# Patient Record
Sex: Female | Born: 1990 | Race: White | Hispanic: Yes | Marital: Married | Smoking: Never smoker
Health system: Southern US, Community
[De-identification: ages and names within clinical notes are randomized; demographics above are authoritative.]

## PROBLEM LIST (undated history)

## (undated) ENCOUNTER — Inpatient Hospital Stay: Payer: Self-pay

## (undated) ENCOUNTER — Emergency Department: Admission: EM | Payer: 59 | Source: Home / Self Care

## (undated) DIAGNOSIS — K649 Unspecified hemorrhoids: Secondary | ICD-10-CM

## (undated) DIAGNOSIS — Z789 Other specified health status: Secondary | ICD-10-CM

## (undated) HISTORY — DX: Unspecified hemorrhoids: K64.9

## (undated) HISTORY — PX: WISDOM TOOTH EXTRACTION: SHX21

---

## 2018-03-04 DIAGNOSIS — K644 Residual hemorrhoidal skin tags: Secondary | ICD-10-CM | POA: Insufficient documentation

## 2018-03-04 DIAGNOSIS — K648 Other hemorrhoids: Secondary | ICD-10-CM | POA: Insufficient documentation

## 2018-03-04 DIAGNOSIS — K649 Unspecified hemorrhoids: Secondary | ICD-10-CM | POA: Insufficient documentation

## 2019-12-22 ENCOUNTER — Ambulatory Visit (INDEPENDENT_AMBULATORY_CARE_PROVIDER_SITE_OTHER): Payer: BLUE CROSS/BLUE SHIELD

## 2019-12-22 ENCOUNTER — Other Ambulatory Visit: Payer: Self-pay

## 2019-12-22 ENCOUNTER — Ambulatory Visit
Admission: EM | Admit: 2019-12-22 | Discharge: 2019-12-22 | Disposition: A | Discharge status: Home / Self Care | Payer: BLUE CROSS/BLUE SHIELD | Attending: Family Medicine | Admitting: Family Medicine

## 2019-12-22 DIAGNOSIS — R109 Unspecified abdominal pain: Secondary | ICD-10-CM | POA: Diagnosis not present

## 2019-12-22 DIAGNOSIS — R1030 Lower abdominal pain, unspecified: Secondary | ICD-10-CM | POA: Insufficient documentation

## 2019-12-22 DIAGNOSIS — N83201 Unspecified ovarian cyst, right side: Secondary | ICD-10-CM | POA: Insufficient documentation

## 2019-12-22 LAB — PREGNANCY, URINE: Preg Test, Ur: NEGATIVE

## 2019-12-22 LAB — CBC WITH DIFFERENTIAL/PLATELET
Abs Immature Granulocytes: 0.03 10*3/uL (ref 0.00–0.07)
Basophils Absolute: 0 10*3/uL (ref 0.0–0.1)
Basophils Relative: 0 %
Eosinophils Absolute: 0.1 10*3/uL (ref 0.0–0.5)
Eosinophils Relative: 1 %
HCT: 38.1 % (ref 36.0–46.0)
Hemoglobin: 12.6 g/dL (ref 12.0–15.0)
Immature Granulocytes: 0 %
Lymphocytes Relative: 27 %
Lymphs Abs: 2.9 10*3/uL (ref 0.7–4.0)
MCH: 26.8 pg (ref 26.0–34.0)
MCHC: 33.1 g/dL (ref 30.0–36.0)
MCV: 81.1 fL (ref 80.0–100.0)
Monocytes Absolute: 0.5 10*3/uL (ref 0.1–1.0)
Monocytes Relative: 5 %
Neutro Abs: 6.9 10*3/uL (ref 1.7–7.7)
Neutrophils Relative %: 67 %
Platelets: 307 10*3/uL (ref 150–400)
RBC: 4.7 MIL/uL (ref 3.87–5.11)
RDW: 13.8 % (ref 11.5–15.5)
WBC: 10.5 10*3/uL (ref 4.0–10.5)
nRBC: 0 % (ref 0.0–0.2)

## 2019-12-22 LAB — URINALYSIS, COMPLETE (UACMP) WITH MICROSCOPIC
Bilirubin Urine: NEGATIVE
Glucose, UA: NEGATIVE mg/dL
Ketones, ur: NEGATIVE mg/dL
Nitrite: NEGATIVE
Protein, ur: NEGATIVE mg/dL
Specific Gravity, Urine: 1.02 (ref 1.005–1.030)
pH: 7 (ref 5.0–8.0)

## 2019-12-22 LAB — BASIC METABOLIC PANEL
Anion gap: 7 (ref 5–15)
BUN: 9 mg/dL (ref 6–20)
CO2: 25 mmol/L (ref 22–32)
Calcium: 9 mg/dL (ref 8.9–10.3)
Chloride: 105 mmol/L (ref 98–111)
Creatinine, Ser: 0.6 mg/dL (ref 0.44–1.00)
GFR calc Af Amer: >60 mL/min (ref >60)
GFR calc non Af Amer: >60 mL/min (ref >60)
Glucose, Bld: 84 mg/dL (ref 70–99)
Potassium: 3.6 mmol/L (ref 3.5–5.1)
Sodium: 137 mmol/L (ref 135–145)

## 2019-12-22 MED ORDER — CEPHALEXIN 500 MG PO CAPS: 500.0000 mg | ORAL_CAPSULE | Freq: Two times a day (BID) | ORAL | 0 refills | Status: AC

## 2019-12-22 MED ORDER — IOHEXOL 300 MG/ML  SOLN
100.0000 mL | Freq: Once | INTRAMUSCULAR | Status: AC | PRN
  Administered 2019-12-22: 15:00:00 100 mL via INTRAVENOUS

## 2019-12-22 NOTE — ED Provider Notes (Addendum)
[facility] ____________________________________________  Time seen: Approximately 3:11 PM  I have reviewed the triage vital signs and the nursing notes.   HISTORY  Chief Complaint Abdominal Pain   HPI Caroline Mendoza is a 29 y.o. female presenting for evaluation of abdominal pain.  Patient reports abdominal pain has been present for 1 week.  States initially she had abdominal pain to right lower quadrant, but now also having pain to the left lower quadrant.  Occasional nausea.  Denies vomiting, diarrhea or atypical constipation.  Denies flank pain, hematuria, dysuria, back pain, vaginal discharge or vaginal complaints.  Denies pregnancy.  Denies concerns of STDs.  Has continued to eat and drink well.  States movement does aggravate pain.  Denies other aggravating or alleviating factors.  No history of similar in the past.  Reports otherwise doing well.  No recent sickness.  Does not drink alcohol.  Patient's last menstrual period was 12/08/2019.    History reviewed. No pertinent past medical history. Denies past medical history.  There are no problems to display for this patient.   History reviewed. No pertinent surgical history.   No current facility-administered medications for this encounter.  Current Outpatient Medications:  .  cephALEXin (KEFLEX) 500 MG capsule, Take 1 capsule (500 mg total) by mouth 2 (two) times daily for 7 days., Disp: 14 capsule, Rfl: 0 .  SPRINTEC 28 0.25-35 MG-MCG tablet, Take 1 tablet by mouth daily., Disp: , Rfl:   Allergies Patient has no known allergies.  family history. Sister: Cholecystectomy.  Social History Social History   Tobacco Use  . Smoking status: Never Smoker  . Smokeless tobacco: Never Used  Substance Use Topics  . Alcohol use: Not Currently  . Drug use: Never    Review of Systems Constitutional: No fever ENT: No sore throat. Cardiovascular: Denies chest pain. Respiratory: Denies shortness of  breath. Gastrointestinal: No abdominal pain.  No nausea, no vomiting.  No diarrhea.  No constipation. Genitourinary: Negative for dysuria. Musculoskeletal: Negative for back pain. Skin: Negative for rash.   ____________________________________________   PHYSICAL EXAM:  VITAL SIGNS: ED Triage Vitals  Enc Vitals Group     BP 12/22/19 1234 (!) 120/91     Pulse Rate 12/22/19 1234 97     Resp 12/22/19 1234 19     Temp 12/22/19 1234 98.1 F (36.7 C)     Temp Source 12/22/19 1234 Oral     SpO2 12/22/19 1234 100 %     Weight 12/22/19 1237 219 lb (99.3 kg)     Height 12/22/19 1237 5\' 4"  (1.626 m)     Head Circumference --      Peak Flow --      Pain Score 12/22/19 1236 10     Pain Loc --      Pain Edu? --      Excl. in [place]? --     Constitutional: Alert and oriented. Well appearing and in no acute distress. Eyes: Conjunctivae are normal.  ENT      Head: Normocephalic and atraumatic. Cardiovascular: Normal rate, regular rhythm. Grossly normal heart sounds.  Good peripheral circulation. Respiratory: Normal respiratory effort without tachypnea nor retractions. Breath sounds are clear and equal bilaterally. No wheezes, rales, rhonchi. Gastrointestinal: No distention. Normal Bowel sounds.  Mild to moderate right lower quadrant and left lower quadrant tenderness palpation.  No suprapubic or pelvic tenderness.  No CVA tenderness. Musculoskeletal:   No midline cervical, thoracic or lumbar tenderness to palpation.  Neurologic:  Normal speech and  language.  Speech is normal. No gait instability.  Skin:  Skin is warm, dry and intact. No rash noted. Psychiatric: Mood and affect are normal. Speech and behavior are normal. Patient exhibits appropriate insight and judgment   ___________________________________________   LABS (all labs ordered are listed, but only abnormal results are displayed)  Labs Reviewed  URINALYSIS, COMPLETE (UACMP) WITH MICROSCOPIC - Abnormal; Notable for the following  components:      Result Value   Hgb urine dipstick TRACE (*)    Leukocytes,Ua TRACE (*)    Bacteria, UA FEW (*)    All other components within normal limits  URINE CULTURE  PREGNANCY, URINE  CBC WITH DIFFERENTIAL/PLATELET  BASIC METABOLIC PANEL    RADIOLOGY  CT ABDOMEN PELVIS W CONTRAST  Result Date: 12/22/2019 CLINICAL DATA:  Lower abdominal pain. Nausea. EXAM: CT ABDOMEN AND PELVIS WITH CONTRAST TECHNIQUE: Multidetector CT imaging of the abdomen and pelvis was performed using the standard protocol following bolus administration of intravenous contrast. CONTRAST:  OMNIPAQUE IOHEXOL 300 MG/ML  SOLN COMPARISON:  None. FINDINGS: Lower chest: Normal. Hepatobiliary: No focal liver abnormality is seen. No gallstones, gallbladder wall thickening, or biliary dilatation. Pancreas: Unremarkable. No pancreatic ductal dilatation or surrounding inflammatory changes. Spleen: Normal in size without focal abnormality. Adrenals/Urinary Tract: Adrenal glands are unremarkable. Kidneys are normal, without renal calculi, focal lesion, or hydronephrosis. Bladder is unremarkable. Stomach/Bowel: Stomach is within normal limits. Appendix appears normal. No evidence of bowel wall thickening, distention, or inflammatory changes. Vascular/Lymphatic: No significant vascular findings are present. No enlarged abdominal or pelvic lymph nodes. Reproductive: 3.2 cm cyst on the right ovary. Uterus and left ovary are normal. Other: Tiny amount of free fluid in the pelvic cul-de-sac, normal for a female of this age. No abdominal wall hernias. Musculoskeletal: No acute or significant osseous findings. IMPRESSION: 3.2 cm cyst on the right ovary. Otherwise, benign-appearing abdomen and pelvis. Electronically Signed   By: Francene Boyers M.D.   On: 12/22/2019 14:48   ____________________________________________   PROCEDURES Procedures   INITIAL IMPRESSION / ASSESSMENT AND PLAN / ED COURSE  Pertinent labs & imaging results  that were available during my care of the patient were reviewed by me and considered in my medical decision making (see chart for details).  Overall well-appearing patient.  No acute distress.  Abdominal pain for the last 1 week.  Urinalysis reviewed, will culture due to potential concern of UTI.  Labs reviewed and discussed with patient.  CT abdomen and pelvis as above per radiologist, 3.2 cm cyst on right ovary, otherwise benign appearing abdomen and pelvis.  Discussed these results in detail with patient.  Will empirically treat with oral Keflex.  Recommend follow-up closely with her primary care or OB/GYN this week.  Discussed strict reevaluation parameters. Discussed indication, risks and benefits of medications with patient.  Discussed follow up and return parameters including no resolution or any worsening concerns. Patient verbalized understanding and agreed to plan.   ____________________________________________   FINAL CLINICAL IMPRESSION(S) / ED DIAGNOSES  Final diagnoses:  Lower abdominal pain  Right ovarian cyst     ED Discharge Orders         Ordered    cephALEXin (KEFLEX) 500 MG capsule  2 times daily     12/22/19 1455           Note: This dictation was prepared with Dragon dictation along with smaller phrase technology. Any transcriptional errors that result from this process are unintentional.  Renford Dills, NP 12/22/19 1606

## 2019-12-22 NOTE — Discharge Instructions (Signed)
Take medication as prescribed. Rest. Drink plenty of fluids.   Follow up with your primary care physician or OBGYN (see above) this week for follow up.    Return to Urgent care or ER for new or worsening concerns.

## 2019-12-22 NOTE — ED Triage Notes (Signed)
Pt with abdominal pain x one week. Started in RLQ and has now migrated to LLQ. Describes as sharp in nature. No vomiting or diarrhea but occasional nausea. No fevers

## 2019-12-23 LAB — URINE CULTURE: Culture: <10000 — AB

## 2019-12-23 NOTE — ED Notes (Signed)
Ct Approved for 12/22/2019 from [place]. Authorization # [id number]. [facility]

## 2020-03-05 ENCOUNTER — Encounter: Payer: BLUE CROSS/BLUE SHIELD | Admitting: Certified Nurse Midwife

## 2020-05-22 ENCOUNTER — Ambulatory Visit: Payer: BLUE CROSS/BLUE SHIELD | Admitting: Family Medicine

## 2020-09-05 ENCOUNTER — Other Ambulatory Visit: Payer: Self-pay

## 2020-09-05 ENCOUNTER — Encounter: Payer: Self-pay | Admitting: Emergency Medicine

## 2020-09-05 ENCOUNTER — Ambulatory Visit
Admission: EM | Admit: 2020-09-05 | Discharge: 2020-09-05 | Disposition: A | Discharge status: Home / Self Care | Payer: BLUE CROSS/BLUE SHIELD | Attending: Emergency Medicine | Admitting: Emergency Medicine

## 2020-09-05 DIAGNOSIS — Z3A01 Less than 8 weeks gestation of pregnancy: Secondary | ICD-10-CM | POA: Diagnosis present

## 2020-09-05 LAB — PREGNANCY, URINE: Preg Test, Ur: POSITIVE — AB

## 2020-09-05 NOTE — ED Triage Notes (Signed)
Patient states that took a pregnancy test at home yesterday and the result was positive.  Patient reports nausea for the past 2 weeks.

## 2020-09-05 NOTE — ED Provider Notes (Signed)
[facility]    CSN: [id number] Arrival date & time: 09/05/20  1212      History   Chief Complaint Chief Complaint  Patient presents with  . Possible Pregnancy    HPI Caroline Mendoza is a 29 y.o. female.   29 yo female who is here for a confirmation pregnancy test. She reports that she has not been feeling herself for the last 2 weeks at home, took a home pregnancy test yesterday that was positive and wanted to be sure.  Her LMP was August 01, 2020. Her menses are irregular since stopping OCP's in April in an attempt to get pregnant.      History reviewed. No pertinent past medical history.  There are no problems to display for this patient.   History reviewed. No pertinent surgical history.  OB History   No obstetric history on file.      Home Medications    Prior to Admission medications   Medication Sig Start Date End Date Taking? Authorizing Provider  Prenatal Vit-Fe Fumarate-FA (PRENATAL VITAMINS PO) Take by mouth.   Yes [provider]  SPRINTEC 28 0.25-35 MG-MCG tablet Take 1 tablet by mouth daily. 12/05/19   [provider]    Family History History reviewed. No pertinent family history.  Social History Social History   Tobacco Use  . Smoking status: Never Smoker  . Smokeless tobacco: Never Used  Vaping Use  . Vaping Use: Never used  Substance Use Topics  . Alcohol use: Not Currently  . Drug use: Never     Allergies   Patient has no known allergies.   Review of Systems Review of Systems  Constitutional: Negative for activity change, appetite change and fever.  HENT: Negative for congestion and rhinorrhea.   Respiratory: Negative for cough, shortness of breath and wheezing.   Cardiovascular: Negative for chest pain.  Gastrointestinal: Positive for nausea. Negative for abdominal pain, constipation and vomiting.  Genitourinary: Positive for menstrual problem. Negative for pelvic pain, vaginal bleeding and  vaginal discharge.  Neurological: Negative for dizziness and syncope.  Hematological: Negative.   Psychiatric/Behavioral: Negative.      Physical Exam Triage Vital Signs ED Triage Vitals  Enc Vitals Group     BP 09/05/20 1229 119/78     Pulse Rate 09/05/20 1229 (!) 104     Resp 09/05/20 1229 14     Temp 09/05/20 1229 98.6 F (37 C)     Temp Source 09/05/20 1229 Oral     SpO2 09/05/20 1229 100 %     Weight 09/05/20 1225 240 lb (108.9 kg)     Height 09/05/20 1225 5\' 4"  (1.626 m)     Head Circumference --      Peak Flow --      Pain Score 09/05/20 1225 0     Pain Loc --      Pain Edu? --      Excl. in [place]? --    No data found.  Updated Vital Signs BP 119/78 (BP Location: Left Arm)   Pulse (!) 104   Temp 98.6 F (37 C) (Oral)   Resp 14   Ht 5\' 4"  (1.626 m)   Wt 240 lb (108.9 kg)   LMP 08/01/2020 (Exact Date)   SpO2 100%   BMI 41.20 kg/m   Visual Acuity Right Eye Distance:   Left Eye Distance:   Bilateral Distance:    Right Eye Near:   Left Eye Near:    Bilateral Near:  Physical Exam Vitals and nursing note reviewed.  Constitutional:      Appearance: She is obese.  HENT:     Head: Normocephalic and atraumatic.  Eyes:     Extraocular Movements: Extraocular movements intact.     Conjunctiva/sclera: Conjunctivae normal.     Pupils: Pupils are equal, round, and reactive to light.  Cardiovascular:     Rate and Rhythm: Regular rhythm.     Pulses: Normal pulses.     Heart sounds: Normal heart sounds.  Pulmonary:     Effort: Pulmonary effort is normal.     Breath sounds: Normal breath sounds.  Musculoskeletal:        General: Normal range of motion.     Cervical back: Normal range of motion and neck supple.  Skin:    General: Skin is warm and dry.     Capillary Refill: Capillary refill takes less than 2 seconds.  Neurological:     General: No focal deficit present.     Mental Status: She is alert and oriented to person, place, and time.  Psychiatric:         Mood and Affect: Mood normal.        Behavior: Behavior normal.        Thought Content: Thought content normal.        Judgment: Judgment normal.      UC Treatments / Results  Labs (all labs ordered are listed, but only abnormal results are displayed) Labs Reviewed  PREGNANCY, URINE - Abnormal; Notable for the following components:      Result Value   Preg Test, Ur POSITIVE (*)    All other components within normal limits    EKG   Radiology No results found.  Procedures Procedures (including critical care time)  Medications Ordered in UC Medications - No data to display  Initial Impression / Assessment and Plan / UC Course  I have reviewed the triage vital signs and the nursing notes.  Pertinent labs & imaging results that were available during my care of the patient were reviewed by me and considered in my medical decision making (see chart for details).   Patient is here for a confirmatory pregnancy test as she took a home test that was positive. She reports feeling nauseated for the past 2 weeks.  She has been trying to get pregnant and went off OCP's in April- menses have been irregular since. She does not have an OB or PCP. Will make referral.    Final Clinical Impressions(s) / UC Diagnoses   Final diagnoses:  Less than [redacted] weeks gestation of pregnancy     Discharge Instructions     I have made a referral for assistance in finding a PCP.   You can also call  [facility] Monday and try to schedule a new patient appointment.  If you smoke or drink stop.  Start taking a pre-natal vitamin daily.     ED Prescriptions    None     PDMP not reviewed this encounter.   Becky [place], NP 09/05/20 1258

## 2020-09-05 NOTE — Discharge Instructions (Signed)
I have made a referral for assistance in finding a PCP.   You can also call [facility] Monday and try to schedule a new patient appointment.  If you smoke or drink stop.  Start taking a pre-natal vitamin daily.

## 2020-09-25 DIAGNOSIS — O09892 Supervision of other high risk pregnancies, second trimester: Secondary | ICD-10-CM | POA: Insufficient documentation

## 2020-10-23 LAB — OB RESULTS CONSOLE GC/CHLAMYDIA
Chlamydia: NEGATIVE
Gonorrhea: NEGATIVE

## 2020-10-23 LAB — OB RESULTS CONSOLE VARICELLA ZOSTER ANTIBODY, IGG: Varicella: IMMUNE

## 2020-10-23 LAB — OB RESULTS CONSOLE RUBELLA ANTIBODY, IGM: Rubella: IMMUNE

## 2020-10-23 LAB — OB RESULTS CONSOLE HEPATITIS B SURFACE ANTIGEN: Hepatitis B Surface Ag: NEGATIVE

## 2020-12-09 ENCOUNTER — Ambulatory Visit
Admission: EM | Admit: 2020-12-09 | Discharge: 2020-12-09 | Disposition: A | Discharge status: Home / Self Care | Payer: BLUE CROSS/BLUE SHIELD | Attending: Family Medicine | Admitting: Family Medicine

## 2020-12-09 ENCOUNTER — Other Ambulatory Visit: Payer: Self-pay

## 2020-12-09 ENCOUNTER — Encounter: Payer: Self-pay | Admitting: Emergency Medicine

## 2020-12-09 DIAGNOSIS — O26892 Other specified pregnancy related conditions, second trimester: Secondary | ICD-10-CM | POA: Insufficient documentation

## 2020-12-09 DIAGNOSIS — Z79899 Other long term (current) drug therapy: Secondary | ICD-10-CM | POA: Insufficient documentation

## 2020-12-09 DIAGNOSIS — R0981 Nasal congestion: Secondary | ICD-10-CM | POA: Diagnosis not present

## 2020-12-09 DIAGNOSIS — B9789 Other viral agents as the cause of diseases classified elsewhere: Secondary | ICD-10-CM | POA: Diagnosis not present

## 2020-12-09 DIAGNOSIS — J988 Other specified respiratory disorders: Secondary | ICD-10-CM | POA: Insufficient documentation

## 2020-12-09 DIAGNOSIS — Z3A17 17 weeks gestation of pregnancy: Secondary | ICD-10-CM | POA: Insufficient documentation

## 2020-12-09 DIAGNOSIS — H9201 Otalgia, right ear: Secondary | ICD-10-CM | POA: Insufficient documentation

## 2020-12-09 DIAGNOSIS — R059 Cough, unspecified: Secondary | ICD-10-CM | POA: Diagnosis not present

## 2020-12-09 DIAGNOSIS — R067 Sneezing: Secondary | ICD-10-CM | POA: Insufficient documentation

## 2020-12-09 DIAGNOSIS — Z20822 Contact with and (suspected) exposure to covid-19: Secondary | ICD-10-CM | POA: Diagnosis not present

## 2020-12-09 LAB — RESP PANEL BY RT-PCR (FLU A&B, COVID) ARPGX2
Influenza A by PCR: NEGATIVE
Influenza B by PCR: NEGATIVE
SARS Coronavirus 2 by RT PCR: NEGATIVE

## 2020-12-09 NOTE — ED Triage Notes (Signed)
Patient c/o right ear pain, nasal congestion and cough that started Sunday.

## 2020-12-09 NOTE — ED Provider Notes (Signed)
[facility]    CSN: [id number] Arrival date & time: 12/09/20  1015      History   Chief Complaint Chief Complaint  Patient presents with  . Cough  . Nasal Congestion  . Ear Pain   HPI  29 year old female who is currently pregnant presents with respiratory symptoms.  Patient reports that her symptoms started on Sunday.  She reports right ear pain, congestion, cough and sneezing.  No fever.  No reported sick contacts.  She is currently [redacted] weeks pregnant.  No relieving factors.  No other complaints.  Home Medications    Prior to Admission medications   Medication Sig Start Date End Date Taking? Authorizing Provider  Prenatal Vit-Fe Fumarate-FA (PRENATAL VITAMINS PO) Take by mouth.   Yes [provider]  SPRINTEC 28 0.25-35 MG-MCG tablet Take 1 tablet by mouth daily. 12/05/19   [provider]   Social History Social History   Tobacco Use  . Smoking status: Never Smoker  . Smokeless tobacco: Never Used  Vaping Use  . Vaping Use: Never used  Substance Use Topics  . Alcohol use: Not Currently  . Drug use: Never     Allergies   Patient has no known allergies.   Review of Systems Review of Systems Per HPI  Physical Exam Triage Vital Signs ED Triage Vitals  Enc Vitals Group     BP 12/09/20 1232 106/76     Pulse Rate 12/09/20 1232 88     Resp 12/09/20 1232 18     Temp 12/09/20 1232 98 F (36.7 C)     Temp Source 12/09/20 1232 Oral     SpO2 12/09/20 1232 100 %     Weight --      Height 12/09/20 1141 5\' 5"  (1.651 m)     Head Circumference --      Peak Flow --      Pain Score 12/09/20 1141 5     Pain Loc --      Pain Edu? --      Excl. in [place]? --    Updated Vital Signs BP 106/76 (BP Location: Right Arm)   Pulse 88   Temp 98 F (36.7 C) (Oral)   Resp 18   Ht 5\' 5"  (1.651 m)   LMP  (Exact Date)   SpO2 100%   BMI 39.94 kg/m   Visual Acuity Right Eye Distance:   Left Eye Distance:   Bilateral Distance:    Right Eye  Near:   Left Eye Near:    Bilateral Near:     Physical Exam Vitals and nursing note reviewed.  Constitutional:      General: She is not in acute distress.    Appearance: Normal appearance. She is not ill-appearing.  HENT:     Head: Normocephalic and atraumatic.  Eyes:     General:        Right eye: No discharge.        Left eye: No discharge.     Conjunctiva/sclera: Conjunctivae normal.  Cardiovascular:     Rate and Rhythm: Normal rate and regular rhythm.  Pulmonary:     Effort: Pulmonary effort is normal.     Breath sounds: Normal breath sounds. No wheezing, rhonchi or rales.  Neurological:     Mental Status: She is alert.  Psychiatric:        Mood and Affect: Mood normal.        Behavior: Behavior normal.    UC Treatments / Results  Labs (all labs ordered are listed, but only abnormal results are displayed) Labs Reviewed  RESP PANEL BY RT-PCR (FLU A&B, COVID) [id number]    EKG   Radiology No results found.  Procedures Procedures (including critical care time)  Medications Ordered in UC Medications - No data to display  Initial Impression / Assessment and Plan / UC Course  I have reviewed the triage vital signs and the nursing notes.  Pertinent labs & imaging results that were available during my care of the patient were reviewed by me and considered in my medical decision making (see chart for details).    29 year old female presents with a viral URI.  Covid and flu testing negative today.  Advised symptomatic treatment with over-the-counter Tylenol.  Advised to call OB/GYN regarding over-the-counter medications for cough and cold that they find acceptable.  Supportive care.  Final Clinical Impressions(s) / UC Diagnoses   Final diagnoses:  Viral respiratory infection     Discharge Instructions     Tylenol 1000 mg three times daily as needed for pain.  Lots of fluids.  OTC medications as recommend by OB GYN.  I will call with COVID test  results.  Take care  Dr. Adriana Simas    ED Prescriptions    None     PDMP not reviewed this encounter.   Tommie Sams, [place] 12/09/20 1416

## 2020-12-09 NOTE — Discharge Instructions (Signed)
Tylenol 1000 mg three times daily as needed for pain.  Lots of fluids.  OTC medications as recommend by OB GYN.  I will call with COVID test results.  Take care  Dr. Adriana Simas

## 2020-12-12 NOTE — L&D Delivery Note (Signed)
Delivery Note  First Stage: Labor onset: 2030 Induction: cytotec, cook cath, pitocin, AROM Analgesia /Anesthesia intrapartum: fentanyl x 1 dose, Epidural.  AROM at 1402  Second Stage: Complete dilation at 0607 Onset of pushing at 0610 FHR second stage Cat II with terminal brady to 60bpm x .   Delivery of a viable female infant at [phone] on 05/09/21 by CNM delivery of fetal head in LOA position with restitution to LOT. tight nuchal cord x 1, Body cord x 1;  Anterior then posterior shoulders delivered easily with gentle downward traction. Baby placed on mom's chest, and attended to by peds.  Cord double clamped after cessation of pulsation, cut by FOB  Third Stage: Placenta delivered spontaneously intact with 3VC @ [phone] Placenta disposition: to pathology, tortuous cord with false knot noted.  Uterine tone Firm / bleeding small  2nd deg perineal laceration identified, brisk bleeding noted and repair initiated prior to placenta delivery.  Anesthesia for repair: epidural Repair in usual fashion with 2-0 Vicryl CT-1 and 3-0 Vicryl SH.  Est. Blood Loss (mL): 1100  Complications: PP hemorrhage  Mom to postpartum.  Baby to Couplet care / Skin to Skin.  Newborn: Birth Weight: 7#12  Apgar Scores: 8/9 Feeding planned: breast

## 2020-12-17 ENCOUNTER — Encounter
Admission: RE | Admit: 2020-12-17 | Discharge: 2020-12-17 | Disposition: A | Discharge status: Home / Self Care | Payer: 59 | Source: Ambulatory Visit | Attending: Anesthesiology | Admitting: Anesthesiology

## 2020-12-17 ENCOUNTER — Other Ambulatory Visit: Payer: Self-pay

## 2020-12-17 NOTE — Consult Note (Signed)
[facility] Anesthesia Consultation  Caroline Mendoza GNF:[id number] DOB: [birth year] DOA: 12/17/2020 PCP: Patient, No Pcp Per   Requesting physician: Dr. Elesa Massed Date of consultation:  Reason for consultation: Obesity during pregnancy  CHIEF COMPLAINT:  Obesity during pregnancy  HISTORY OF PRESENT ILLNESS: Caroline Mendoza  is a 30 y.o. female with a known history of obesity during pregnancy.  This is her first pregnancy and she denies any complications or medial problems.  Her only medication is a prenatal vitamin.  Denies bleeding disorders.  PAST MEDICAL HISTORY:  No past medical history on file.  PAST SURGICAL HISTORY: No past surgical history on file.  SOCIAL HISTORY:  Social History   Tobacco Use  . Smoking status: Never Smoker  . Smokeless tobacco: Never Used  Substance Use Topics  . Alcohol use: Not Currently    FAMILY HISTORY: No family history on file.  DRUG ALLERGIES: No Known Allergies  REVIEW OF SYSTEMS:   RESPIRATORY: No cough, shortness of breath, wheezing.  CARDIOVASCULAR: No chest pain, orthopnea, edema.  HEMATOLOGY: No anemia, easy bruising or bleeding SKIN: No rash or lesion. NEUROLOGIC: No tingling, numbness, weakness.  PSYCHIATRY: No anxiety or depression.   MEDICATIONS AT HOME:  Prior to Admission medications   Medication Sig Start Date End Date Taking? Authorizing Provider  Prenatal Vit-Fe Fumarate-FA (PRENATAL VITAMINS PO) Take by mouth.    [provider]  SPRINTEC 28 0.25-35 MG-MCG tablet Take 1 tablet by mouth daily. 12/05/19   [provider]      PHYSICAL EXAMINATION:   VITAL SIGNS: Last menstrual period 08/01/2020.  GENERAL:  30 y.o.-year-old patient no acute distress.  HEENT: Head atraumatic, normocephalic. Oropharynx and nasopharynx clear. MP II, TM distance >3 cm, normal mouth opening. LUNGS: No use of accessory muscles of respiration.   EXTREMITIES: No pedal edema, cyanosis, or  clubbing.  NEUROLOGIC: normal gait PSYCHIATRIC: The patient is alert and oriented x 3.  SKIN: No obvious rash, lesion, or ulcer.    IMPRESSION AND PLAN:   Caroline Mendoza  is a 30 y.o. female presenting with obesity during pregnancy. BMI is currently 40 at [redacted] weeks gestation. Airway exam is reassuring today.  We discussed analgesic options during labor including epidural analgesia. Discussed that in obesity there can be increased difficulty with epidural placement or even failure of successful epidural. We also discussed that even after successful epidural placement there is increased risk of catheter migration out of the epidural space that would require catheter replacement. Discussed use of epidural vs spinal vs GA if cesarean delivery is required. Discussed increased risk of difficult intubation during pregnancy should an emergency cesarean delivery be required.   We discussed repeat evaluation at 35-36 weeks by anesthesia to determine whether there is a high risk of complications of anesthesia for which we would recommend transfer of OB care to a facility with a higher maternal level of care designation.    Karleen Hampshire, MD

## 2021-01-08 DIAGNOSIS — R002 Palpitations: Secondary | ICD-10-CM | POA: Insufficient documentation

## 2021-02-05 DIAGNOSIS — R000 Tachycardia, unspecified: Secondary | ICD-10-CM | POA: Insufficient documentation

## 2021-04-07 ENCOUNTER — Emergency Department: Payer: 59

## 2021-04-07 ENCOUNTER — Observation Stay
Admission: AD | Admit: 2021-04-07 | Discharge: 2021-04-07 | Disposition: A | Discharge status: Home / Self Care | Payer: 59 | Source: Ambulatory Visit | Attending: Obstetrics and Gynecology | Admitting: Obstetrics and Gynecology

## 2021-04-07 ENCOUNTER — Encounter: Payer: Self-pay | Admitting: Emergency Medicine

## 2021-04-07 ENCOUNTER — Emergency Department
Admission: EM | Admit: 2021-04-07 | Discharge: 2021-04-07 | Disposition: A | Discharge status: Home / Self Care | Payer: 59 | Source: Home / Self Care | Attending: Emergency Medicine | Admitting: Emergency Medicine

## 2021-04-07 ENCOUNTER — Encounter: Payer: Self-pay | Admitting: Obstetrics and Gynecology

## 2021-04-07 ENCOUNTER — Other Ambulatory Visit: Payer: Self-pay

## 2021-04-07 DIAGNOSIS — Z3A35 35 weeks gestation of pregnancy: Secondary | ICD-10-CM | POA: Diagnosis not present

## 2021-04-07 DIAGNOSIS — Z20822 Contact with and (suspected) exposure to covid-19: Secondary | ICD-10-CM | POA: Insufficient documentation

## 2021-04-07 DIAGNOSIS — O2343 Unspecified infection of urinary tract in pregnancy, third trimester: Secondary | ICD-10-CM | POA: Insufficient documentation

## 2021-04-07 DIAGNOSIS — O368131 Decreased fetal movements, third trimester, fetus 1: Secondary | ICD-10-CM | POA: Diagnosis not present

## 2021-04-07 DIAGNOSIS — R8281 Pyuria: Secondary | ICD-10-CM

## 2021-04-07 DIAGNOSIS — Z87891 Personal history of nicotine dependence: Secondary | ICD-10-CM | POA: Insufficient documentation

## 2021-04-07 DIAGNOSIS — Z3A34 34 weeks gestation of pregnancy: Secondary | ICD-10-CM

## 2021-04-07 DIAGNOSIS — Z7982 Long term (current) use of aspirin: Secondary | ICD-10-CM | POA: Diagnosis not present

## 2021-04-07 HISTORY — DX: Other specified health status: Z78.9

## 2021-04-07 LAB — CBC WITH DIFFERENTIAL/PLATELET
Abs Immature Granulocytes: 0.08 10*3/uL — ABNORMAL HIGH (ref 0.00–0.07)
Basophils Absolute: 0 10*3/uL (ref 0.0–0.1)
Basophils Relative: 0 %
Eosinophils Absolute: 0.1 10*3/uL (ref 0.0–0.5)
Eosinophils Relative: 1 %
HCT: 36.2 % (ref 36.0–46.0)
Hemoglobin: 11.8 g/dL — ABNORMAL LOW (ref 12.0–15.0)
Immature Granulocytes: 1 %
Lymphocytes Relative: 15 %
Lymphs Abs: 2.3 10*3/uL (ref 0.7–4.0)
MCH: 26.8 pg (ref 26.0–34.0)
MCHC: 32.6 g/dL (ref 30.0–36.0)
MCV: 82.3 fL (ref 80.0–100.0)
Monocytes Absolute: 0.8 10*3/uL (ref 0.1–1.0)
Monocytes Relative: 6 %
Neutro Abs: 11.5 10*3/uL — ABNORMAL HIGH (ref 1.7–7.7)
Neutrophils Relative %: 77 %
Platelets: 372 10*3/uL (ref 150–400)
RBC: 4.4 MIL/uL (ref 3.87–5.11)
RDW: 13.5 % (ref 11.5–15.5)
WBC: 14.8 10*3/uL — ABNORMAL HIGH (ref 4.0–10.5)
nRBC: 0 % (ref 0.0–0.2)

## 2021-04-07 LAB — URINALYSIS, COMPLETE (UACMP) WITH MICROSCOPIC
Bilirubin Urine: NEGATIVE
Glucose, UA: NEGATIVE mg/dL
Ketones, ur: NEGATIVE mg/dL
Nitrite: NEGATIVE
Protein, ur: NEGATIVE mg/dL
Specific Gravity, Urine: 1.008 (ref 1.005–1.030)
pH: 7 (ref 5.0–8.0)

## 2021-04-07 LAB — BASIC METABOLIC PANEL
Anion gap: 11 (ref 5–15)
BUN: 7 mg/dL (ref 6–20)
CO2: 21 mmol/L — ABNORMAL LOW (ref 22–32)
Calcium: 9.3 mg/dL (ref 8.9–10.3)
Chloride: 104 mmol/L (ref 98–111)
Creatinine, Ser: 0.6 mg/dL (ref 0.44–1.00)
GFR, Estimated: >60 mL/min (ref >60)
Glucose, Bld: 110 mg/dL — ABNORMAL HIGH (ref 70–99)
Potassium: 4 mmol/L (ref 3.5–5.1)
Sodium: 136 mmol/L (ref 135–145)

## 2021-04-07 LAB — RESP PANEL BY RT-PCR (FLU A&B, COVID) ARPGX2
Influenza A by PCR: NEGATIVE
Influenza B by PCR: NEGATIVE
SARS Coronavirus 2 by RT PCR: NEGATIVE

## 2021-04-07 LAB — GROUP A STREP BY PCR: Group A Strep by PCR: NOT DETECTED

## 2021-04-07 MED ORDER — CEPHALEXIN 500 MG PO CAPS
500.0000 mg | ORAL_CAPSULE | Freq: Three times a day (TID) | ORAL | 0 refills | Status: DC
Start: 2021-04-07 — End: 2021-05-06

## 2021-04-07 MED ORDER — CEPHALEXIN 500 MG PO CAPS
500.0000 mg | ORAL_CAPSULE | Freq: Once | ORAL | Status: AC
  Administered 2021-04-07: 500 mg via ORAL
  Filled 2021-04-07: qty 1

## 2021-04-07 NOTE — Progress Notes (Signed)
Reviewed discharge instructions with patient. Patient verbalizes understanding and had no questions. Patient left ambulatory with significant other, going home.

## 2021-04-07 NOTE — Discharge Instructions (Signed)
Follow-up with your OB GYN at [facility].

## 2021-04-07 NOTE — ED Triage Notes (Signed)
Pt states 2 days ago, sore throat, body aches and ear pain began. Denies known covid interaction. NAD. Walked from lobby to room 43 with no distress.

## 2021-04-07 NOTE — ED Triage Notes (Signed)
Pt is [redacted] weeks pregnant and feeling decreased fetal movement today.

## 2021-04-07 NOTE — ED Provider Notes (Signed)
[facility] Emergency Department Provider Note  ____________________________________________   Event Date/Time   First MD Initiated Contact with Patient 04/07/21 [phone]     (approximate)  I have reviewed the triage vital signs and the nursing notes.   HISTORY  Chief Complaint Otalgia, Sore Throat, and Generalized Body Aches   HPI Caroline Mendoza is a 30 y.o. female presents to the ED with complaint of sore throat, body aches and ear pain that began 2 days ago.  Patient denies any known COVID exposure.  She is unaware of any fever denies chills, nausea, vomiting, change in taste or smell.  Patient is also [redacted] weeks pregnant and states that she is concerned due to decreased fetal movement today.  She denies any vaginal pain or discharge.  She is followed by [facility].  Denies any pain at this time.       Past Medical History:  Diagnosis Date  . Medical history non-contributory     Patient Active Problem List   Diagnosis Date Noted  . Indication for care in labor or delivery 04/07/2021    Past Surgical History:  Procedure Laterality Date  . WISDOM TOOTH EXTRACTION      Prior to Admission medications   Medication Sig Start Date End Date Taking? Authorizing Provider  cephALEXin (KEFLEX) 500 MG capsule Take 1 capsule (500 mg total) by mouth 3 (three) times daily. 04/07/21  Yes Tommi Rumps, PA-C  aspirin 81 MG chewable tablet Chew by mouth daily.    [provider]  Prenatal Vit-Fe Fumarate-FA (PRENATAL VITAMINS PO) Take by mouth.    [provider]  SPRINTEC 28 0.25-35 MG-MCG tablet Take 1 tablet by mouth daily. Patient not taking: Reported on 04/07/2021 12/05/19   [provider]    Allergies Patient has no known allergies.  History reviewed. No pertinent family history.  Social History Social History   Tobacco Use  . Smoking status: Never Smoker  . Smokeless tobacco: Never Used  Vaping Use  . Vaping  Use: Never used  Substance Use Topics  . Alcohol use: Not Currently  . Drug use: Never    Review of Systems Constitutional: No fever/chills Eyes: No visual changes. ENT: Positive sore throat and earache. Cardiovascular: Denies chest pain. Respiratory: Denies shortness of breath.  Negative for cough. Gastrointestinal: No abdominal pain.  No nausea, no vomiting.  No diarrhea.  No constipation. Genitourinary: Negative for dysuria. Musculoskeletal: Positive for body aches. Skin: Negative for rash. Neurological: Negative for headaches, focal weakness or numbness.  ____________________________________________   PHYSICAL EXAM:  VITAL SIGNS: ED Triage Vitals  Enc Vitals Group     BP 04/07/21 0938 121/70     Pulse Rate 04/07/21 0938 (!) 123     Resp 04/07/21 0938 17     Temp 04/07/21 0938 97.7 F (36.5 C)     Temp Source 04/07/21 0938 Oral     SpO2 04/07/21 0938 97 %     Weight 04/07/21 0943 300 lb (136.1 kg)     Height 04/07/21 0943 5\' 5"  (1.651 m)     Head Circumference --      Peak Flow --      Pain Score 04/07/21 0942 0     Pain Loc --      Pain Edu? --      Excl. in GC? --     Constitutional: Alert and oriented. Well appearing and in no acute distress. Eyes: Conjunctivae are normal. PERRL. EOMI. Head: Atraumatic.  Nose: No congestion/no rhinnorhea.  EACs are clear, TMs are dull but no erythema or injection is noted. Mouth/Throat: Mucous membranes are moist.  Oropharynx non-erythematous.  No exudate and uvula is midline. Neck: No stridor.   Hematological/Lymphatic/Immunilogical: No cervical lymphadenopathy. Cardiovascular: Normal rate, regular rhythm. Grossly normal heart sounds.  Good peripheral circulation. Respiratory: Normal respiratory effort.  No retractions. Lungs CTAB. Gastrointestinal: Soft and nontender. No distention.  CVA tenderness noted. Musculoskeletal: His upper and lower extremities without any difficulty.  No appreciated edema noted lower  extremities. Neurologic:  Normal speech and language. No gross focal neurologic deficits are appreciated. No gait instability. Skin:  Skin is warm, dry and intact. No rash noted. Psychiatric: Mood and affect are normal. Speech and behavior are normal.  FHTs per nursing staff 144 ____________________________________________   LABS (all labs ordered are listed, but only abnormal results are displayed)  Labs Reviewed  URINALYSIS, COMPLETE (UACMP) WITH MICROSCOPIC - Abnormal; Notable for the following components:      Result Value   Color, Urine YELLOW (*)    APPearance HAZY (*)    Hgb urine dipstick SMALL (*)    Leukocytes,Ua SMALL (*)    Bacteria, UA FEW (*)    All other components within normal limits  CBC WITH DIFFERENTIAL/PLATELET - Abnormal; Notable for the following components:   WBC 14.8 (*)    Hemoglobin 11.8 (*)    Neutro Abs 11.5 (*)    Abs Immature Granulocytes 0.08 (*)    All other components within normal limits  BASIC METABOLIC PANEL - Abnormal; Notable for the following components:   CO2 21 (*)    Glucose, Bld 110 (*)    All other components within normal limits  GROUP A STREP BY PCR  RESP PANEL BY RT-PCR (FLU A&B, COVID) [id number]  URINE CULTURE   ____________________________________________ ___________________________________________  RADIOLOGY Beaulah Corin, personally viewed and evaluated these images (plain radiographs) as part of my medical decision making, as well as reviewing the written report by the radiologist.   Official radiology report(s): DG Chest Port 1 View  Result Date: 04/07/2021 CLINICAL DATA:  Upper respiratory infection, sore throat, body aches, ear pain, third trimester pregnancy EXAM: PORTABLE CHEST 1 VIEW COMPARISON:  None. FINDINGS: Normal heart size. Normal mediastinal contour. No pneumothorax. No pleural effusion. Lungs appear clear, with no acute consolidative airspace disease and no pulmonary edema. IMPRESSION: No active  disease. Electronically Signed   By: Delbert Phenix M.D.   On: 04/07/2021 12:13    ____________________________________________   PROCEDURES  Procedure(s) performed (including Critical Care):  Procedures   ____________________________________________   INITIAL IMPRESSION / ASSESSMENT AND PLAN / ED COURSE  As part of my medical decision making, I reviewed the following data within the electronic MEDICAL RECORD NUMBER Notes from prior ED visits and [place] Controlled Substance Database  30 year old female presents to the ED with concerns of decreased fetal movement beginning today.  Patient also has complaint of sore throat, body aches and ear pain.  She states that these symptoms began 2 days ago.  Physical exam was benign however because patient is [redacted] weeks pregnant test were ordered.  Strep, COVID, influenza were negative.  Urinalysis did show WBCs with small amount of bacteria and urine culture was ordered.  BMP was unremarkable and CBC showed WBC 14.8 which most likely is due to pregnancy.  Chest x-ray was negative for cardiopulmonary disease.  After being cleared in the emergency department patient was transferred to L&D for observation due to  decreased fetal movement per patient.  She was discharged after being given Keflex 500 mg in the ED and a prescription for the same was sent to the pharmacy.  Her OB/GYN will be able to see the culture results.  ____________________________________________   FINAL CLINICAL IMPRESSION(S) / ED DIAGNOSES  Final diagnoses:  Pyuria  [redacted] weeks gestation of pregnancy     ED Discharge Orders         Ordered    cephALEXin (KEFLEX) 500 MG capsule  3 times daily        04/07/21 1259          *Please note:  Caroline Mendoza was evaluated in Emergency Department on 04/07/2021 for the symptoms described in the history of present illness. She was evaluated in the context of the global COVID-19 pandemic, which necessitated consideration that the patient might be at  risk for infection with the SARS-CoV-2 virus that causes COVID-19. Institutional protocols and algorithms that pertain to the evaluation of patients at risk for COVID-19 are in a state of rapid change based on information released by regulatory bodies including the CDC and federal and state organizations. These policies and algorithms were followed during the patient's care in the ED.  Some ED evaluations and interventions may be delayed as a result of limited staffing during and the pandemic.*   Note:  This document was prepared using Dragon voice recognition software and may include unintentional dictation errors.    Tommi Rumps, PA-C 04/07/21 1552    Chesley Noon, MD 04/07/21 [phone]

## 2021-04-07 NOTE — ED Notes (Signed)
fht assessed, strong and wnl. Unable to count for appropriate amount of time as baby was moving. Apple juice given, pt states she had not drank or eaten anything today.

## 2021-04-07 NOTE — OB Triage Note (Signed)
Patient states that she woke up about 830a and did not feel baby moving as much as normal. Patient denies any bleeding or LOF. Patient states no pain, 0/10. Denies CTX.  VSS. Monitors applied and assessing.

## 2021-04-07 NOTE — ED Notes (Signed)
FHT found left abdomen 144

## 2021-04-08 ENCOUNTER — Encounter: Payer: Self-pay | Admitting: Family Medicine

## 2021-04-08 ENCOUNTER — Ambulatory Visit: Payer: 59 | Admitting: Family Medicine

## 2021-04-08 VITALS — BP 104/78 | HR 104 | Temp 98.4°F | Ht 65.0 in | Wt 288.8 lb

## 2021-04-08 DIAGNOSIS — R7309 Other abnormal glucose: Secondary | ICD-10-CM | POA: Diagnosis not present

## 2021-04-08 DIAGNOSIS — Z6841 Body Mass Index (BMI) 40.0 and over, adult: Secondary | ICD-10-CM

## 2021-04-08 DIAGNOSIS — O26843 Uterine size-date discrepancy, third trimester: Secondary | ICD-10-CM | POA: Insufficient documentation

## 2021-04-08 DIAGNOSIS — M25561 Pain in right knee: Secondary | ICD-10-CM | POA: Diagnosis not present

## 2021-04-08 DIAGNOSIS — R102 Pelvic and perineal pain unspecified side: Secondary | ICD-10-CM

## 2021-04-08 DIAGNOSIS — J309 Allergic rhinitis, unspecified: Secondary | ICD-10-CM | POA: Insufficient documentation

## 2021-04-08 DIAGNOSIS — O3680X0 Pregnancy with inconclusive fetal viability, not applicable or unspecified: Secondary | ICD-10-CM | POA: Insufficient documentation

## 2021-04-08 DIAGNOSIS — R739 Hyperglycemia, unspecified: Secondary | ICD-10-CM | POA: Insufficient documentation

## 2021-04-08 DIAGNOSIS — J3089 Other allergic rhinitis: Secondary | ICD-10-CM

## 2021-04-08 HISTORY — DX: Pelvic and perineal pain: R10.2

## 2021-04-08 HISTORY — DX: Pelvic and perineal pain unspecified side: R10.20

## 2021-04-08 LAB — URINE CULTURE
Culture: <10000 — AB
Special Requests: NORMAL

## 2021-04-08 NOTE — Assessment & Plan Note (Signed)
Risk stratification labs to be obtained. 

## 2021-04-08 NOTE — Patient Instructions (Signed)
-   Start saline nasal spray and use nightly, in morning, and as-needed for congestion - Use "breathe right" strips nightly - Increase free water intake - Use ice pack around knee (front to back) x 20 minutes 2-3 times / day and as-needed - Can dose Tylenol (acetaminophen) as-needed - Follow-up in 4 weeks, contact us for questions

## 2021-04-08 NOTE — Assessment & Plan Note (Signed)
Patient with new onset atraumatic right anterior knee pain in the setting of pregnancy.  She does give a distant history of intermittent near buckling of the right knee.  Pain without radiation, localized about the anterior knee.  This pain is aggravated with motion after a period of immobility, associated with stiffness, denies overt swelling, no mechanical locking.  Examination reveals full range of motion with painful maximal flexion and extension, maximal tenderness at the lateral patellar facet and secondarily to the lateral joint line, no overt laxity.  Have advised supportive care with scheduled ice regimen, as needed acetaminophen, and we will reach out to patient's OB/GYN for possible topical analgesic medications such as Biofreeze if appropriate and indicated.  Cortisone can be reserved for severe recalcitrant symptoms.  Patient to follow-up in 4 weeks.

## 2021-04-08 NOTE — Assessment & Plan Note (Signed)
Patient with stated bilateral ear pain in the setting of nasal congestion, increased mucus production, cough.  Denies any shortness of breath, fevers, has had an episode of chills.  Recent ER visit for UTI diagnosed and has been on a 7-day course of Keflex.  Examination today reveals benign ear canals and tympanic membranes bilaterally, bilateral nasal turbinate inflammation and mild erythema, nontender sinuses, mildly injected oropharynx without exudate, mildly tender right cervical lymphadenopathy, clear lung fields and benign cardiac sounds.  Given ongoing pregnancy, I have advised supportive care with intranasal saline, Breathe Right strips, free water intake increase.  We will reach out to patient's OB/GYN for possible antihistamine/intranasal steroid if appropriate and needed.  She will return in 4 weeks for reevaluation.

## 2021-04-08 NOTE — Progress Notes (Signed)
Primary Care / Sports Medicine Office Visit  Patient Information:  Patient ID: Caroline Mendoza, female DOB: 10/07/1991 Age: 30 y.o. MRN: [id number]   Caroline Mendoza is a pleasant 30 y.o. female presenting with the following:  Chief Complaint  Patient presents with  . New Patient (Initial Visit)  . Establish Care    [redacted] weeks pregnant; from [facility]  . Otalgia    Bilateral, more severe on right side; started Tuesday,04/06/21, worst in the morning; no pain in office today; on Keflex for recent diagnosis of UTI  . Knee Pain    Right; weight-gain due to pregnancy; x1 month; no pain in office today; more severe when getting up from a seated position and before bed    Review of Systems pertinent details above   Patient Active Problem List   Diagnosis Date Noted  . Pelvic pain in female 04/08/2021  . Pregnancy with uncertain fetal viability 04/08/2021  . Uterine size-date discrepancy in third trimester 04/08/2021  . Elevated random blood glucose level 04/08/2021  . BMI 45.0-49.9, adult (HCC) 04/08/2021  . Patellofemoral arthralgia of right knee 04/08/2021  . Allergic rhinitis 04/08/2021  . Indication for care in labor or delivery 04/07/2021  . 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 Heart palpitations 01/08/2021  . Supervision of other high risk pregnancies, second trimester 09/25/2020  . Unspecified hemorrhoids 03/04/2018   Past Medical History:  Diagnosis Date  . Medical history non-contributory    Outpatient Medications Prior to Visit  Medication Sig Dispense Refill  . aspirin 81 MG chewable tablet Chew 81 mg by mouth daily.    . cephALEXin (KEFLEX) 500 MG capsule Take 1 capsule (500 mg total) by mouth 3 (three) times daily. 21 capsule 0  . Prenatal Vit-Fe Fumarate-FA (PRENATAL VITAMINS PO) Take 2 each by mouth daily.    . SPRINTEC 28 0.25-35 MG-MCG tablet Take 1 tablet by mouth daily. (Patient not taking: Reported on 04/07/2021)     No  facility-administered medications prior to visit.    Past Surgical History:  Procedure Laterality Date  . WISDOM TOOTH EXTRACTION     Social History   Socioeconomic History  . Marital status: Married    Spouse name: [name]  . Number of children: 0  . Years of education: 16  . Highest education level: Bachelor's degree (e.g., BA, AB, BS)  Occupational History  . Occupation: Call center  Tobacco Use  . Smoking status: Never Smoker  . Smokeless tobacco: Never Used  Vaping Use  . Vaping Use: Never used  Substance and Sexual Activity  . Alcohol use: Not Currently  . Drug use: Never  . Sexual activity: Not Currently    Partners: Male    Birth control/protection: None  Other Topics Concern  . Not on file  Social History Narrative  . Not on file   Social Determinants of Health   Financial Resource Strain: Not on file  Food Insecurity: Not on file  Transportation Needs: Not on file  Physical Activity: Not on file  Stress: Not on file  Social Connections: Not on file  Intimate Partner Violence: Not on file   Family History  Problem Relation Age of Onset  . Healthy Mother   . Heart attack Father        2000  . Stroke Father        2020  . Healthy Sister   . Healthy Brother   . Healthy Sister    No Known Allergies  Vitals:   04/08/21 1536  BP: 104/78  Pulse: (!) 104  Temp: 98.4 F (36.9 C)  SpO2: 97%   Vitals:   04/08/21 1536  Weight: 288 lb 12.8 oz (131 kg)  Height: 5\' 5"  (1.651 m)   Body mass index is 48.06 kg/m.  DG Chest Port 1 View  Result Date: 04/07/2021 CLINICAL DATA:  Upper respiratory infection, sore throat, body aches, ear pain, third trimester pregnancy EXAM: PORTABLE CHEST 1 VIEW COMPARISON:  None. FINDINGS: Normal heart size. Normal mediastinal contour. No pneumothorax. No pleural effusion. Lungs appear clear, with no acute consolidative airspace disease and no pulmonary edema. IMPRESSION: No active disease. Electronically  Signed   By: 04/09/2021 M.D.   On: 04/07/2021 12:13     Independent interpretation of notes and tests performed by another provider:   None  Procedures performed:   None  Pertinent History, Exam, Impression, and Recommendations:   Allergic rhinitis Patient with stated bilateral ear pain in the setting of nasal congestion, increased mucus production, cough.  Denies any shortness of breath, fevers, has had an episode of chills.  Recent ER visit for UTI diagnosed and has been on a 7-day course of Keflex.  Examination today reveals benign ear canals and tympanic membranes bilaterally, bilateral nasal turbinate inflammation and mild erythema, nontender sinuses, mildly injected oropharynx without exudate, mildly tender right cervical lymphadenopathy, clear lung fields and benign cardiac sounds.  Given ongoing pregnancy, I have advised supportive care with intranasal saline, Breathe Right strips, free water intake increase.  We will reach out to patient's OB/GYN for possible antihistamine/intranasal steroid if appropriate and needed.  She will return in 4 weeks for reevaluation.  Elevated random blood glucose level Risk stratification labs to be obtained  BMI 45.0-49.9, adult (HCC) Risk stratification labs to be obtained  Patellofemoral arthralgia of right knee Patient with new onset atraumatic right anterior knee pain in the setting of pregnancy.  She does give a distant history of intermittent near buckling of the right knee.  Pain without radiation, localized about the anterior knee.  This pain is aggravated with motion after a period of immobility, associated with stiffness, denies overt swelling, no mechanical locking.  Examination reveals full range of motion with painful maximal flexion and extension, maximal tenderness at the lateral patellar facet and secondarily to the lateral joint line, no overt laxity.  Have advised supportive care with scheduled ice regimen, as needed  acetaminophen, and we will reach out to patient's OB/GYN for possible topical analgesic medications such as Biofreeze if appropriate and indicated.  Cortisone can be reserved for severe recalcitrant symptoms.  Patient to follow-up in 4 weeks.    Orders & Medications No orders of the defined types were placed in this encounter.  Orders Placed This Encounter  Procedures  . TSH  . Lipid panel  . Hemoglobin A1c     Return in about 4 weeks (around 05/06/2021).     05/08/2021, MD   Primary Care Sports Medicine [facility]

## 2021-04-09 ENCOUNTER — Ambulatory Visit: Payer: Self-pay | Admitting: Family Medicine

## 2021-04-09 LAB — HEMOGLOBIN A1C
Est. average glucose Bld gHb Est-mCnc: 117 mg/dL
Hgb A1c MFr Bld: 5.7 % — ABNORMAL HIGH (ref 4.8–5.6)

## 2021-04-09 LAB — LIPID PANEL
Chol/HDL Ratio: 4 ratio (ref 0.0–4.4)
Cholesterol, Total: 337 mg/dL — ABNORMAL HIGH (ref 100–199)
HDL: 85 mg/dL (ref >39)
LDL Chol Calc (NIH): 206 mg/dL — ABNORMAL HIGH (ref 0–99)
Triglycerides: 240 mg/dL — ABNORMAL HIGH (ref 0–149)
VLDL Cholesterol Cal: 46 mg/dL — ABNORMAL HIGH (ref 5–40)

## 2021-04-09 LAB — TSH: TSH: 3.14 u[IU]/mL (ref 0.450–4.500)

## 2021-04-10 NOTE — Discharge Summary (Signed)
Patient ID: Caroline Mendoza MRN: [id number] DOB/AGE: Sep 11, 1991 30 y.o.  Admit date: 04/07/2021 Discharge date: 04/10/2021  Admission Diagnoses: Seen in ED for 2 days of sore throat, body aches and ear pain.  Reported decreased FM while in the ED.  Sent to triage for NST.  Discharge Diagnoses: Reassuring FHT  Prenatal Procedures: NST  Consults: none  Significant Diagnostic Studies: none  Treatments: none  Hospital Course:  This is a 30 y.o. G1P0 with IUP at [redacted]w[redacted]d admitted for decreased FM.  No leaking of fluid and no bleeding.  She was observed, fetal heart rate monitoring remained reassuring, and she had no signs/symptoms of labor or other maternal-fetal concerns.  She was deemed stable for discharge to home with outpatient follow up.  Discharge Physical Exam:  BP 117/65 (BP Location: Left Arm)   Pulse 99   Temp 98.3 F (36.8 C) (Oral)   Resp 18   Ht 5\' 5"  (1.651 m)   Wt 136 kg   LMP 08/01/2020 (Exact Date)   BMI 49.89 kg/m   General: NAD CV: RRR Pulm: CTABL, nl effort ABD: s/nd/nt, gravid DVT Evaluation: LE non-ttp, no evidence of DVT on exam.  NST: FHR baseline: 130 bpm Variability: moderate Accelerations: yes Decelerations: none Category/reactivity: reactive  TOCO: quiet SVE: deferred      Discharge Condition: Stable  Disposition: Discharge disposition: 01-Home or Self Care        Allergies as of 04/07/2021   No Known Allergies     Medication List    ASK your doctor about these medications   aspirin 81 MG chewable tablet Chew 81 mg by mouth daily.   cephALEXin 500 MG capsule Commonly known as: KEFLEX Take 1 capsule (500 mg total) by mouth 3 (three) times daily.   PRENATAL VITAMINS PO Take 2 each by mouth daily.        Signed4/29/2022, CNM 04/10/2021 9:31 AM

## 2021-04-13 ENCOUNTER — Ambulatory Visit: Payer: Self-pay | Admitting: Family Medicine

## 2021-04-16 ENCOUNTER — Other Ambulatory Visit: Payer: Self-pay | Admitting: Obstetrics and Gynecology

## 2021-04-16 LAB — OB RESULTS CONSOLE HIV ANTIBODY (ROUTINE TESTING): HIV: NONREACTIVE

## 2021-04-16 LAB — OB RESULTS CONSOLE GBS: GBS: POSITIVE

## 2021-04-16 NOTE — Progress Notes (Signed)
Dating: EDD: 05/14/21  by LMP: 08/01/20 and c/w [place] at 7 wks.   Preg c/b: 1. Palpitations, referred to Cards 2. Obesity, BMI 44.9 with 80lb weight gain   Prenatal Labs: Blood type/Rh  AB Pos  Antibody screen neg  Rubella Immune  Varicella Immune  RPR NR  HBsAg Neg  HIV NR  GC neg  Chlamydia neg  Genetic screening Negative MaterniT21, neg AFP  1 hour GTT 166  3 hour GTT  [phone]  GBS  pending done 04/16/21    Contraception: TBD Infant feeding: TBD Tdap: 02/26/21 Flu: 12/18/20

## 2021-04-19 ENCOUNTER — Encounter: Payer: Self-pay | Admitting: Family Medicine

## 2021-05-04 ENCOUNTER — Inpatient Hospital Stay: Admission: RE | Admit: 2021-05-04 | Payer: 59 | Source: Ambulatory Visit

## 2021-05-06 ENCOUNTER — Telehealth (INDEPENDENT_AMBULATORY_CARE_PROVIDER_SITE_OTHER): Payer: 59 | Admitting: Family Medicine

## 2021-05-06 ENCOUNTER — Other Ambulatory Visit: Payer: Self-pay

## 2021-05-06 ENCOUNTER — Encounter: Payer: Self-pay | Admitting: Family Medicine

## 2021-05-06 ENCOUNTER — Other Ambulatory Visit
Admission: RE | Admit: 2021-05-06 | Discharge: 2021-05-06 | Disposition: A | Discharge status: Home / Self Care | Payer: 59 | Source: Ambulatory Visit

## 2021-05-06 VITALS — Ht 65.0 in | Wt 300.0 lb

## 2021-05-06 DIAGNOSIS — J3089 Other allergic rhinitis: Secondary | ICD-10-CM | POA: Diagnosis not present

## 2021-05-06 DIAGNOSIS — Z20822 Contact with and (suspected) exposure to covid-19: Secondary | ICD-10-CM | POA: Insufficient documentation

## 2021-05-06 DIAGNOSIS — Z01812 Encounter for preprocedural laboratory examination: Secondary | ICD-10-CM | POA: Insufficient documentation

## 2021-05-06 DIAGNOSIS — M25561 Pain in right knee: Secondary | ICD-10-CM

## 2021-05-06 DIAGNOSIS — E785 Hyperlipidemia, unspecified: Secondary | ICD-10-CM

## 2021-05-06 LAB — SARS CORONAVIRUS 2 (TAT 6-24 HRS): SARS Coronavirus 2: NEGATIVE

## 2021-05-06 NOTE — Assessment & Plan Note (Signed)
Patient states that her symptoms have improved though not resolved, no clear response from OB/GYN regarding possibility of intra-articular cortisone should she require this.  I have advised her to reach out to her OB/GYN when appropriate so that we can consider this option if needed.  Over the interim I have advised supportive care and to contact me for any questions.

## 2021-05-06 NOTE — Assessment & Plan Note (Signed)
Recent serum studies reveal hyperlipidemia, given current/ongoing pregnancy, have advised diet/exercise modifications once patient is able to do so after giving birth.  I did recommend recheck of lipids in 6 months and in person visit at that time.

## 2021-05-06 NOTE — Assessment & Plan Note (Signed)
Patient reports near resolution of symptoms, intermittently symptomatic, these have resolved with time alone as she did not proceed with Flonase, antihistamine, nasal strips, or saline.  I have advised the possibility of OTC treatments should the need arise.  She can otherwise follow-up on an as-needed basis for this issue.

## 2021-05-06 NOTE — Telephone Encounter (Signed)
I discussed the limitations, risks, security and privacy concerns of performing an evaluation and management service by WebEx/MyChart/Doximity Video, including the higher likelihood of inaccurate diagnosis and treatment, and the availability of in person appointments. We also discussed the likely need of an additional face to face encounter for complete and high quality delivery of care. I also discussed with the patient that there may be a patient responsible charge related to this service. The patient expressed understanding and wishes to proceed.  

## 2021-05-06 NOTE — Progress Notes (Signed)
Primary Care / Sports Medicine Virtual Visit  Patient Information:  Patient ID: Caroline Mendoza, female DOB: Apr 16, 1991 Age: 30 y.o. MRN: [id number]   Caroline Mendoza is a pleasant 30 y.o. female presenting with the following:  Chief Complaint  Patient presents with  . Follow-up    Being induced on Saturday, 05/08/21; no longer having sinus symptoms    Review of Systems: No fevers, chills, night sweats, weight loss, chest pain, or shortness of breath.   Patient Active Problem List   Diagnosis Date Noted  . Hyperlipidemia 05/06/2021  . Pelvic pain in female 04/08/2021  . Pregnancy with uncertain fetal viability 04/08/2021  . Uterine size-date discrepancy in third trimester 04/08/2021  . Elevated random blood glucose level 04/08/2021  . BMI 45.0-49.9, adult (HCC) 04/08/2021  . Patellofemoral arthralgia of right knee 04/08/2021  . Allergic rhinitis 04/08/2021  . Indication for care in labor or delivery 04/07/2021  . 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 Heart palpitations 01/08/2021  . Supervision of other high risk pregnancies, second trimester 09/25/2020  . Unspecified hemorrhoids 03/04/2018   Past Medical History:  Diagnosis Date  . Medical history non-contributory    Outpatient Medications Prior to Visit  Medication Sig Dispense Refill  . aspirin 81 MG chewable tablet Chew 81 mg by mouth daily.    . Prenatal Vit-Fe Fumarate-FA (PRENATAL VITAMINS PO) Take 2 each by mouth daily.    . cephALEXin (KEFLEX) 500 MG capsule Take 1 capsule (500 mg total) by mouth 3 (three) times daily. 21 capsule 0   No facility-administered medications prior to visit.    Past Surgical History:  Procedure Laterality Date  . WISDOM TOOTH EXTRACTION     Social History   Socioeconomic History  . Marital status: Married    Spouse name: [name]  . Number of children: 0  . Years of education: 16  . Highest education level: Bachelor's  degree (e.g., BA, AB, BS)  Occupational History  . Occupation: Call center  Tobacco Use  . Smoking status: Never Smoker  . Smokeless tobacco: Never Used  Vaping Use  . Vaping Use: Never used  Substance and Sexual Activity  . Alcohol use: Not Currently  . Drug use: Never  . Sexual activity: Not Currently    Partners: Male    Birth control/protection: None  Other Topics Concern  . Not on file  Social History Narrative  . Not on file   Social Determinants of Health   Financial Resource Strain: Not on file  Food Insecurity: Not on file  Transportation Needs: Not on file  Physical Activity: Not on file  Stress: Not on file  Social Connections: Not on file  Intimate Partner Violence: Not on file   Family History  Problem Relation Age of Onset  . Healthy Mother   . Heart attack Father        2000  . Stroke Father        2020  . Healthy Sister   . Healthy Brother   . Healthy Sister    No Known Allergies  Virtual Visit via MyChart Video:   I connected with@ on 05/06/21 via MyChart Video and verified that I am speaking with the correct person using appropriate identifiers.   The limitations, risks, security and privacy concerns of performing an evaluation and management service by MyChart Video, including the higher likelihood of inaccurate diagnoses and treatments, and the availability of in person appointments were reviewed. The possible need of an  additional face-to-face encounter for complete and high quality delivery of care was discussed. The patient was also made aware that there may be a patient responsible charge related to this service. The patient expressed understanding and wishes to proceed.  Provider location is in medical facility. Patient location is at their home, different from provider location. People involved in care of the patient during this telehealth encounter were myself, my nurse/medical assistant, and my front office/scheduling team  member.  Objective findings:   General: Speaking full sentences, no audible heavy breathing. Sounds alert and appropriately interactive. Well-appearing. Face symmetric. Extraocular movements intact. Pupils equal and round. No nasal flaring or accessory muscle use visualized.  Independent interpretation of notes and tests performed by another provider:   None  Pertinent History, Exam, Impression, and Recommendations:   Patellofemoral arthralgia of right knee Patient states that her symptoms have improved though not resolved, no clear response from OB/GYN regarding possibility of intra-articular cortisone should she require this.  I have advised her to reach out to her OB/GYN when appropriate so that we can consider this option if needed.  Over the interim I have advised supportive care and to contact me for any questions.  Hyperlipidemia Recent serum studies reveal hyperlipidemia, given current/ongoing pregnancy, have advised diet/exercise modifications once patient is able to do so after giving birth.  I did recommend recheck of lipids in 6 months and in person visit at that time.  Allergic rhinitis Patient reports near resolution of symptoms, intermittently symptomatic, these have resolved with time alone as she did not proceed with Flonase, antihistamine, nasal strips, or saline.  I have advised the possibility of OTC treatments should the need arise.  She can otherwise follow-up on an as-needed basis for this issue.   Orders & Medications No orders of the defined types were placed in this encounter.  Orders Placed This Encounter  Procedures  . Lipid panel     I discussed the above assessment and treatment plan with the patient. The patient was provided an opportunity to ask questions and all were answered. The patient agreed with the plan and demonstrated an understanding of the instructions.   The patient was advised to call back or seek an in-person evaluation if the symptoms  worsen or if the condition fails to improve as anticipated.   I provided a total time of 33 minutes including both face-to-face and non-face-to-face time on 05/06/2021 inclusive of time utilized for medical chart review, information gathering, care coordination with staff, and documentation completion.    Jerrol Banana, MD   Primary Care Sports Medicine [facility]

## 2021-05-07 ENCOUNTER — Ambulatory Visit: Payer: 59 | Admitting: Family Medicine

## 2021-05-08 ENCOUNTER — Inpatient Hospital Stay: Payer: 59 | Admitting: Pediatrics

## 2021-05-08 ENCOUNTER — Inpatient Hospital Stay
Admission: EM | Admit: 2021-05-08 | Discharge: 2021-05-11 | DRG: 806 | Disposition: A | Discharge status: Home / Self Care | Payer: 59 | Attending: Obstetrics and Gynecology | Admitting: Obstetrics and Gynecology

## 2021-05-08 ENCOUNTER — Other Ambulatory Visit: Payer: Self-pay

## 2021-05-08 ENCOUNTER — Encounter: Payer: Self-pay | Admitting: Obstetrics and Gynecology

## 2021-05-08 DIAGNOSIS — O9081 Anemia of the puerperium: Secondary | ICD-10-CM | POA: Diagnosis not present

## 2021-05-08 DIAGNOSIS — O99214 Obesity complicating childbirth: Secondary | ICD-10-CM | POA: Diagnosis present

## 2021-05-08 DIAGNOSIS — Z3A39 39 weeks gestation of pregnancy: Secondary | ICD-10-CM

## 2021-05-08 DIAGNOSIS — D62 Acute posthemorrhagic anemia: Secondary | ICD-10-CM | POA: Diagnosis not present

## 2021-05-08 DIAGNOSIS — Z20822 Contact with and (suspected) exposure to covid-19: Secondary | ICD-10-CM | POA: Diagnosis present

## 2021-05-08 DIAGNOSIS — O99213 Obesity complicating pregnancy, third trimester: Secondary | ICD-10-CM | POA: Diagnosis present

## 2021-05-08 LAB — COMPREHENSIVE METABOLIC PANEL
ALT: 16 U/L (ref 0–44)
AST: 29 U/L (ref 15–41)
Albumin: 2.9 g/dL — ABNORMAL LOW (ref 3.5–5.0)
Alkaline Phosphatase: 168 U/L — ABNORMAL HIGH (ref 38–126)
Anion gap: 11 (ref 5–15)
BUN: 12 mg/dL (ref 6–20)
CO2: 20 mmol/L — ABNORMAL LOW (ref 22–32)
Calcium: 9.4 mg/dL (ref 8.9–10.3)
Chloride: 103 mmol/L (ref 98–111)
Creatinine, Ser: 0.63 mg/dL (ref 0.44–1.00)
GFR, Estimated: >60 mL/min (ref >60)
Glucose, Bld: 139 mg/dL — ABNORMAL HIGH (ref 70–99)
Potassium: 3.7 mmol/L (ref 3.5–5.1)
Sodium: 134 mmol/L — ABNORMAL LOW (ref 135–145)
Total Bilirubin: 0.7 mg/dL (ref 0.3–1.2)
Total Protein: 7.2 g/dL (ref 6.5–8.1)

## 2021-05-08 LAB — CBC
HCT: 36.1 % (ref 36.0–46.0)
Hemoglobin: 11.8 g/dL — ABNORMAL LOW (ref 12.0–15.0)
MCH: 26.5 pg (ref 26.0–34.0)
MCHC: 32.7 g/dL (ref 30.0–36.0)
MCV: 81.1 fL (ref 80.0–100.0)
Platelets: 383 10*3/uL (ref 150–400)
RBC: 4.45 MIL/uL (ref 3.87–5.11)
RDW: 13.9 % (ref 11.5–15.5)
WBC: 14.4 10*3/uL — ABNORMAL HIGH (ref 4.0–10.5)
nRBC: 0 % (ref 0.0–0.2)

## 2021-05-08 LAB — PROTEIN / CREATININE RATIO, URINE
Creatinine, Urine: 71 mg/dL
Protein Creatinine Ratio: 0.11 mg/mg{Cre} (ref 0.00–0.15)
Total Protein, Urine: 8 mg/dL

## 2021-05-08 LAB — RPR: RPR Ser Ql: NONREACTIVE

## 2021-05-08 LAB — ABO/RH: ABO/RH(D): AB POS

## 2021-05-08 MED ORDER — LIDOCAINE HCL (PF) 1 % IJ SOLN
INTRAMUSCULAR | Status: AC
  Filled 2021-05-08: qty 30

## 2021-05-08 MED ORDER — AMMONIA AROMATIC IN INHA
RESPIRATORY_TRACT | Status: AC
  Filled 2021-05-08: qty 10

## 2021-05-08 MED ORDER — LIDOCAINE HCL (PF) 1 % IJ SOLN: 30.0000 mL | INTRAMUSCULAR | Status: DC | PRN

## 2021-05-08 MED ORDER — LACTATED RINGERS IV SOLN: INTRAVENOUS | Status: DC

## 2021-05-08 MED ORDER — TERBUTALINE SULFATE 1 MG/ML IJ SOLN: 0.2500 mg | Freq: Once | INTRAMUSCULAR | Status: DC | PRN

## 2021-05-08 MED ORDER — DIPHENHYDRAMINE HCL 50 MG/ML IJ SOLN: 12.5000 mg | INTRAMUSCULAR | Status: DC | PRN

## 2021-05-08 MED ORDER — ACETAMINOPHEN 325 MG PO TABS: 650.0000 mg | ORAL_TABLET | ORAL | Status: DC | PRN

## 2021-05-08 MED ORDER — LACTATED RINGERS IV SOLN: 500.0000 mL | INTRAVENOUS | Status: DC | PRN

## 2021-05-08 MED ORDER — PENICILLIN G POT IN DEXTROSE 60000 UNIT/ML IV SOLN
3.0000 10*6.[IU] | INTRAVENOUS | Status: DC
  Administered 2021-05-08 – 2021-05-09 (×4): 3 10*6.[IU] via INTRAVENOUS
  Filled 2021-05-08 (×4): qty 50

## 2021-05-08 MED ORDER — EPHEDRINE 5 MG/ML INJ: 10.0000 mg | INTRAVENOUS | Status: DC | PRN

## 2021-05-08 MED ORDER — MISOPROSTOL 25 MCG QUARTER TABLET
25.0000 ug | ORAL_TABLET | ORAL | Status: DC | PRN
Start: 2021-05-08 — End: 2021-05-09
  Administered 2021-05-08 (×2): 25 ug via BUCCAL
  Filled 2021-05-08 (×3): qty 1

## 2021-05-08 MED ORDER — FENTANYL 2.5 MCG/ML W/ROPIVACAINE 0.15% IN NS 100 ML EPIDURAL (ARMC)
EPIDURAL | Status: AC
  Administered 2021-05-08: 12 mL/h via EPIDURAL
  Filled 2021-05-08: qty 100

## 2021-05-08 MED ORDER — PHENYLEPHRINE 40 MCG/ML (10ML) SYRINGE FOR IV PUSH (FOR BLOOD PRESSURE SUPPORT): 80.0000 ug | PREFILLED_SYRINGE | INTRAVENOUS | Status: DC | PRN

## 2021-05-08 MED ORDER — OXYTOCIN-SODIUM CHLORIDE 30-0.9 UT/500ML-% IV SOLN
1.0000 m[IU]/min | INTRAVENOUS | Status: DC
  Administered 2021-05-08: 2 m[IU]/min via INTRAVENOUS
  Filled 2021-05-08: qty 500

## 2021-05-08 MED ORDER — BUPIVACAINE HCL (PF) 0.25 % IJ SOLN
INTRAMUSCULAR | Status: DC | PRN
  Administered 2021-05-08: 6 mL via EPIDURAL

## 2021-05-08 MED ORDER — MISOPROSTOL 200 MCG PO TABS
ORAL_TABLET | ORAL | Status: AC
  Filled 2021-05-08: qty 4

## 2021-05-08 MED ORDER — FENTANYL 2.5 MCG/ML W/ROPIVACAINE 0.15% IN NS 100 ML EPIDURAL (ARMC)
12.0000 mL/h | EPIDURAL | Status: DC
Start: 2021-05-08 — End: 2021-05-09
  Administered 2021-05-08 – 2021-05-09 (×2): 12 mL/h via EPIDURAL
  Filled 2021-05-08 (×2): qty 100

## 2021-05-08 MED ORDER — ONDANSETRON HCL 4 MG/2ML IJ SOLN
4.0000 mg | Freq: Four times a day (QID) | INTRAMUSCULAR | Status: DC | PRN
Start: 2021-05-08 — End: 2021-05-09

## 2021-05-08 MED ORDER — SOD CITRATE-CITRIC ACID 500-334 MG/5ML PO SOLN: 30.0000 mL | ORAL | Status: DC | PRN

## 2021-05-08 MED ORDER — OXYTOCIN-SODIUM CHLORIDE 30-0.9 UT/500ML-% IV SOLN
2.5000 [IU]/h | INTRAVENOUS | Status: DC
  Administered 2021-05-09: 2.5 [IU]/h via INTRAVENOUS
  Filled 2021-05-08: qty 500

## 2021-05-08 MED ORDER — FENTANYL CITRATE (PF) 100 MCG/2ML IJ SOLN
50.0000 ug | INTRAMUSCULAR | Status: DC | PRN
  Administered 2021-05-08: 50 ug via INTRAVENOUS
  Filled 2021-05-08: qty 2

## 2021-05-08 MED ORDER — SODIUM CHLORIDE 0.9 % IV SOLN
5.0000 10*6.[IU] | Freq: Once | INTRAVENOUS | Status: AC
  Administered 2021-05-08: 5 10*6.[IU] via INTRAVENOUS
  Filled 2021-05-08: qty 5

## 2021-05-08 MED ORDER — CALCIUM CARBONATE ANTACID 500 MG PO CHEW
4.0000 | CHEWABLE_TABLET | Freq: Once | ORAL | Status: AC
  Administered 2021-05-08: 800 mg via ORAL
  Filled 2021-05-08 (×2): qty 4

## 2021-05-08 MED ORDER — MISOPROSTOL 25 MCG QUARTER TABLET
25.0000 ug | ORAL_TABLET | ORAL | Status: DC | PRN
  Administered 2021-05-08 (×2): 25 ug via VAGINAL
  Filled 2021-05-08 (×3): qty 1

## 2021-05-08 MED ORDER — LACTATED RINGERS IV SOLN
500.0000 mL | Freq: Once | INTRAVENOUS | Status: AC
  Administered 2021-05-08: 500 mL via INTRAVENOUS

## 2021-05-08 MED ORDER — OXYTOCIN BOLUS FROM INFUSION
333.0000 mL | Freq: Once | INTRAVENOUS | Status: AC
Start: 2021-05-08 — End: 2021-05-09
  Administered 2021-05-09: 333 mL via INTRAVENOUS

## 2021-05-08 MED ORDER — OXYTOCIN 10 UNIT/ML IJ SOLN
INTRAMUSCULAR | Status: AC
  Filled 2021-05-08: qty 2

## 2021-05-08 MED ORDER — LIDOCAINE-EPINEPHRINE (PF) 1.5 %-1:200000 IJ SOLN
INTRAMUSCULAR | Status: DC | PRN
  Administered 2021-05-08: 3 mL via EPIDURAL

## 2021-05-08 NOTE — Progress Notes (Signed)
Labor Progress Note  Caroline Mendoza is a 30 y.o. G1P0 at [redacted]w[redacted]d by [place] admitted for induction of labor due to morbid obesity.  Subjective: comfortable after epidural.   Objective: BP 116/66   Pulse 84   Temp 97.7 F (36.5 C) (Oral)   Resp 18   Ht 5\' 5"  (1.651 m)   Wt 131.5 kg   LMP 08/01/2020 (Exact Date)   SpO2 97%   BMI 48.26 kg/m  Notable VS details: reviewed.   Fetal Assessment: FHT:  FHR: 120 bpm, variability: moderate,  accelerations:  Present,  decelerations:  Present early Category/reactivity:  Category I UC:   regular, every 2 minutes, Pitocin at 18mu/min SVE:   Cook cath noted in vaginal vault. Removed intact.  5/80/-1 with BBOW, AROM performed, large amt clear fluid with normal bloody show.  Membrane status: AROM at 1402  Amniotic color: clear  Labs: Lab Results  Component Value Date   WBC 14.4 (H) 05/08/2021   HGB 11.8 (L) 05/08/2021   HCT 36.1 05/08/2021   MCV 81.1 05/08/2021   PLT 383 05/08/2021    Assessment / Plan: IOL at 39.1wks, morbid obesity  Labor: cytotec x 2 doses, Cook cath and pitocin. Now AROM, progressing well.  Preeclampsia:  no e/o Pre-E Fetal Wellbeing:  Category II - will monitor closely, position changes.  Pain Control:  Epidural I/D:  GBS Pos, s/p PCN at 1320 Anticipated MOD:  NSVD  05/10/2021 Dejane Scheibe, CNM 05/08/2021, 2:12 PM

## 2021-05-08 NOTE — Progress Notes (Signed)
Labor Progress Note  Caroline Mendoza is a 30 y.o. G1P0 at [redacted]w[redacted]d by [place] admitted for induction of labor due to morbid obesity.  Subjective: comfortable after epidural.   Objective: BP 116/66   Pulse 84   Temp 97.7 F (36.5 C) (Oral)   Resp 18   Ht 5\' 5"  (1.651 m)   Wt 131.5 kg   LMP 08/01/2020 (Exact Date)   SpO2 97%   BMI 48.26 kg/m  Notable VS details: reviewed.   Fetal Assessment: FHT:  FHR: 125 bpm, variability: moderate,  accelerations:  Present,  decelerations:  Present early and variables.  Category/reactivity:  Category II UC:   regular, every 2 minutes, Pitocin at 36mu/min SVE:  6/80/-1, some caput noted, normal bloody show.  - IUPC placed.  Membrane status: AROM at 1402  Amniotic color: clear  Labs: Lab Results  Component Value Date   WBC 14.4 (H) 05/08/2021   HGB 11.8 (L) 05/08/2021   HCT 36.1 05/08/2021   MCV 81.1 05/08/2021   PLT 383 05/08/2021    Assessment / Plan: IOL at 39.1wks, morbid obesity  Labor: cytotec x 2 doses, Cook cath and pitocin. Now AROM, IUPC placed to assess and titrate Pitocin.  Preeclampsia:  no e/o Pre-E Fetal Wellbeing:  Category II - monitor closely.  Pain Control:  Epidural I/D:  GBS Pos, s/p PCN at 1320 Anticipated MOD:  NSVD   05/10/2021, CNM 05/08/2021, 3:43 PM

## 2021-05-08 NOTE — H&P (Signed)
OB History & Physical   History of Present Illness:  Chief Complaint: scheduled IOL  HPI:  Caroline Mendoza is a 30 y.o. G1P0 female at [redacted]w[redacted]d dated by LMP 08/01/20 and c/w [place] at 7wks.  She presents to L&D for scheduled IOL due to morbid obesity.   Reports active FM, started feeling UCs during the night, uncomfortable and q33min. Denies LOF or VB.    Pregnancy Issues: 1. Palpitations, referred to Cards 2. Obesity, BMI 44.9 with excess weight gain, BMI 50.1 on admit   Maternal Medical History:   Past Medical History:  Diagnosis Date  . Medical history non-contributory     Past Surgical History:  Procedure Laterality Date  . WISDOM TOOTH EXTRACTION      No Known Allergies  Prior to Admission medications   Medication Sig Start Date End Date Taking? Authorizing Provider  aspirin 81 MG chewable tablet Chew 81 mg by mouth daily.   Yes [provider]  Prenatal Vit-Fe Fumarate-FA (PRENATAL VITAMINS PO) Take 2 each by mouth daily.   Yes [provider]     Prenatal care site: [facility]  Social History: She  reports that she has never smoked. She has never used smokeless tobacco. She reports previous alcohol use. She reports that she does not use drugs.  Family History: family history includes Healthy in her brother, mother, sister, and sister; Heart attack in her father; Stroke in her father.   Review of Systems: A full review of systems was performed and negative except as noted in the HPI.     Physical Exam:  Vital Signs: BP 122/83 (BP Location: Right Arm)   Pulse (!) 106   Temp 97.8 F (36.6 C) (Oral)   Resp 18   Ht 5\' 5"  (1.651 m)   Wt 131.5 kg   LMP 08/01/2020 (Exact Date)   BMI 48.26 kg/m  General: no acute distress.  HEENT: normocephalic, atraumatic Heart: regular rate & rhythm.  No murmurs/rubs/gallops Lungs: clear to auscultation bilaterally, normal respiratory effort Abdomen: soft, gravid, non-tender;  EFW:  8lbs Pelvic:   External: Normal external female genitalia  Cervix: Dilation: Closed / Effacement (%): Thick / Station: -3  - cx very posterior, soft; some bloody show noted.  08/03/2020 cath placed, 52ml uterine and vaginal balloons filled.   Extremities: non-tender, symmetric, no edema bilaterally.  DTRs: 2+  Neurologic: Alert & oriented x 3.    Results for orders placed or performed during the hospital encounter of 05/08/21 (from the past 24 hour(s))  CBC     Status: Abnormal   Collection Time: 05/08/21  1:02 AM  Result Value Ref Range   WBC 14.4 (H) 4.0 - 10.5 K/uL   RBC 4.45 3.87 - 5.11 MIL/uL   Hemoglobin 11.8 (L) 12.0 - 15.0 g/dL   HCT 05/10/21 72.6 - 20.3 %   MCV 81.1 80.0 - 100.0 fL   MCH 26.5 26.0 - 34.0 pg   MCHC 32.7 30.0 - 36.0 g/dL   RDW 55.9 74.1 - 63.8 %   Platelets 383 150 - 400 K/uL   nRBC 0.0 0.0 - 0.2 %  Type and screen     Status: None   Collection Time: 05/08/21  1:02 AM  Result Value Ref Range   ABO/RH(D) AB POS    Antibody Screen NEG    Sample Expiration      05/11/2021,2359 Performed at [facility], [address] [place], [place]   Protein / creatinine ratio,  urine     Status: None   Collection Time: 05/08/21  1:02 AM  Result Value Ref Range   Creatinine, Urine 71 mg/dL   Total Protein, Urine 8 mg/dL   Protein Creatinine Ratio 0.11 0.00 - 0.15 mg/mg[Cre]  Comprehensive metabolic panel     Status: Abnormal   Collection Time: 05/08/21  1:02 AM  Result Value Ref Range   Sodium 134 (L) 135 - 145 mmol/L   Potassium 3.7 3.5 - 5.1 mmol/L   Chloride 103 98 - 111 mmol/L   CO2 20 (L) 22 - 32 mmol/L   Glucose, Bld 139 (H) 70 - 99 mg/dL   BUN 12 6 - 20 mg/dL   Creatinine, Ser 2.68 0.44 - 1.00 mg/dL   Calcium 9.4 8.9 - 34.1 mg/dL   Total Protein 7.2 6.5 - 8.1 g/dL   Albumin 2.9 (L) 3.5 - 5.0 g/dL   AST 29 15 - 41 U/L   ALT 16 0 - 44 U/L   Alkaline Phosphatase 168 (H) 38 - 126 U/L   Total Bilirubin 0.7 0.3 - 1.2 mg/dL   GFR, Estimated  >96 >22 mL/min   Anion gap 11 5 - 15  ABO/Rh     Status: None   Collection Time: 05/08/21  1:53 AM  Result Value Ref Range   ABO/RH(D)      AB POS Performed at [facility], [address] [place], [place] [address]     Pertinent Results:  Prenatal Labs: Blood type/Rh  AB Pos  Antibody screen neg  Rubella Immune  Varicella Immune  RPR NR  HBsAg Neg  HIV NR  GC neg  Chlamydia neg  Genetic screening Negative MaterniT21, neg AFP  1 hour GTT 166  3 hour GTT  [phone]  GBS  POS     FHT: 135bpm, mod varaibility, + accels, occasional variables.  TOCO: q1-6min, mild to palp.  SVE:  Dilation: Closed / Effacement (%): Thick / Station: -3    Cephalic by leopolds/SVE and confirmed with [place]  No results found.  Assessment:  Caroline Mendoza is a 30 y.o. G1P0 female at [redacted]w[redacted]d with IOL at term due to morbid obesity.   Plan:  1. Admit to Labor & Delivery; consents reviewed and obtained - COVID neg on admit - d/w Dr Feliberto Gottron - anesthesia consult due to BMI 50+ - [facility] in place. Plan to start pitocin.   2. Fetal Well being  - Fetal Tracing: Cat II - Group B Streptococcus ppx indicated: Positive - Presentation: cephalic confirmed by US/exam   3. Routine OB: - Prenatal labs reviewed, as above - Rh AB Pos  - CBC, T&S, RPR on admit - Clear fluids, IVF  4. Induction of Labor -  Contractions: external toco in place -  Pelvis adequate for TOL -  Plan for induction with cytotec, Cook cath, pitocin.  -  Plan for continuous fetal monitoring  -  Maternal pain control as desired - Anticipate vaginal delivery  5. Post Partum Planning: - Infant feeding: breast - Contraception: TBD - Tdap 02/26/21 - Flu 12/18/20  Caroline Mendoza, CNM 05/08/21 10:07 AM

## 2021-05-08 NOTE — Anesthesia Preprocedure Evaluation (Signed)
Anesthesia Evaluation  Patient identified by MRN, date of birth, ID band Patient awake    Reviewed: Allergy & Precautions, NPO status , Patient's Chart, lab work & pertinent test results  History of Anesthesia Complications Negative for: history of anesthetic complications  Airway Mallampati: II  TM Distance: >3 FB Neck ROM: Full    Dental  (+) Teeth Intact, Dental Advisory Given   Pulmonary neg pulmonary ROS,    Pulmonary exam normal breath sounds clear to auscultation       Cardiovascular Exercise Tolerance: Good negative cardio ROS Normal cardiovascular exam Rhythm:Regular Rate:Normal     Neuro/Psych negative neurological ROS  negative psych ROS   GI/Hepatic negative GI ROS, Neg liver ROS,   Endo/Other  Morbid obesity (BMI 48)  Renal/GU negative Renal ROS  negative genitourinary   Musculoskeletal negative musculoskeletal ROS (+)   Abdominal (+) + obese,   Peds negative pediatric ROS (+)  Hematology negative hematology ROS (+)   Anesthesia Other Findings   Reproductive/Obstetrics (+) Pregnancy 30 y.o. G1P0 female at [redacted]w[redacted]d dated by LMP 08/01/20 and c/w [place] at 7wks. She presents to L&D for scheduled IOL due to morbid obesity.                              Anesthesia Physical Anesthesia Plan  ASA: III  Anesthesia Plan: Epidural   Post-op Pain Management:    Induction:   PONV Risk Score and Plan:   Airway Management Planned:   Additional Equipment: None  Intra-op Plan:   Post-operative Plan:   Informed Consent: I have reviewed the patients History and Physical, chart, labs and discussed the procedure including the risks, benefits and alternatives for the proposed anesthesia with the patient or authorized representative who has indicated his/her understanding and acceptance.       Plan Discussed with: Anesthesiologist  Anesthesia Plan Comments: (Anesthesia consult given  BMI 48. History, exam performed. Patient desires epidural once foley balloon comes out. Discussed risks, benefits, plan for epidural.)        Anesthesia Quick Evaluation

## 2021-05-08 NOTE — Anesthesia Procedure Notes (Signed)
Epidural Patient location during procedure: OB Start time: 05/08/2021 12:29 PM End time: 05/08/2021 12:42 PM  Staffing Anesthesiologist: Donato Schultz, MD Performed: anesthesiologist   Preanesthetic Checklist Completed: patient identified, IV checked, site marked, risks and benefits discussed, monitors and equipment checked, pre-op evaluation and timeout performed  Epidural Patient position: sitting Prep: ChloraPrep Patient monitoring: heart rate, continuous pulse ox and blood pressure Approach: midline Location: L3-L4 Injection technique: LOR saline  Needle:  Needle type: Tuohy  Needle gauge: 18 G Needle length: 9 cm Needle insertion depth: 6.5 cm Catheter size: 20 Guage Catheter at skin depth: 12 cm Test dose: negative and 1.5% lidocaine with Epi 1:200 K  Additional Notes Crisp loss of resistance to saline on 1st pass. Negative aspiration, negative test dose. Patient tolerated the procedure well. No apparent complications.Reason for block:at surgeon's request and procedure for pain

## 2021-05-08 NOTE — Progress Notes (Signed)
Labor Progress Note  Caroline Mendoza is a 30 y.o. G1P0 at [redacted]w[redacted]d by [place] admitted for induction of labor due to morbid obesity.  Subjective: intermittent rectal pressure noted.   Objective: BP 120/75   Pulse 93   Temp 98.9 F (37.2 C) (Oral)   Resp 16   Ht 5\' 5"  (1.651 m)   Wt 131.5 kg   LMP 08/01/2020 (Exact Date)   SpO2 98%   BMI 48.26 kg/m  Notable VS details: reviewed.   Fetal Assessment: FHT:  FHR: 125 bpm, variability: moderate,  accelerations:  Present,  decelerations:  Present early and variables.  Category/reactivity:  Category II UC:   regular, every 1-2 minutes, Pitocin at 62mu/min; MVUs inadequate, 135-155 SVE:  7/90/0, some caput noted, normal bloody show.   Membrane status: AROM at 1402  Amniotic color: clear   Labs: Lab Results  Component Value Date   WBC 14.4 (H) 05/08/2021   HGB 11.8 (L) 05/08/2021   HCT 36.1 05/08/2021   MCV 81.1 05/08/2021   PLT 383 05/08/2021    Assessment / Plan: IOL at 39.1wks, morbid obesity  Labor: cytotec x 2 doses, Cook cath and pitocin. Now AROM, IUPC, FSe. No cervical change since last exam, inadequate MVUs, increased pitocin causing no resting tone, decreased pitocin with less MVUs. Will give pitocin break, give TUMS to attempt reset receptors.  Preeclampsia:  no e/o Pre-E Fetal Wellbeing:  Category II - monitor closely.  Pain Control:  Epidural I/D:  GBS Pos, s/p PCN at 1320, 2nd dose given at 1724  Anticipated MOD:  NSVD   05/10/2021, CNM 05/08/2021, 10:24 PM

## 2021-05-08 NOTE — Progress Notes (Signed)
Labor Progress Note  Caroline Mendoza is a 30 y.o. G1P0 at [redacted]w[redacted]d by [place] admitted for induction of labor due to morbid obesity.  Subjective: comfortable after epidural.   Objective: BP 122/66   Pulse 78   Temp 98 F (36.7 C) (Oral)   Resp 18   Ht 5\' 5"  (1.651 m)   Wt 131.5 kg   LMP 08/01/2020 (Exact Date)   SpO2 98%   BMI 48.26 kg/m  Notable VS details: reviewed.   Fetal Assessment: FHT:  FHR: 125 bpm, variability: moderate,  accelerations:  Present,  decelerations:  Present early and variables.  Category/reactivity:  Category II UC:   regular, every 2-3 minutes, Pitocin at 50mu/min SVE:  6/90/-1, some caput noted, normal bloody show.   Membrane status: AROM at 1402  Amniotic color: clear   Labs: Lab Results  Component Value Date   WBC 14.4 (H) 05/08/2021   HGB 11.8 (L) 05/08/2021   HCT 36.1 05/08/2021   MCV 81.1 05/08/2021   PLT 383 05/08/2021    Assessment / Plan: IOL at 39.1wks, morbid obesity  Labor: cytotec x 2 doses, Cook cath and pitocin. Now AROM, IUPC, Pitocin titrating.  Preeclampsia:  no e/o Pre-E Fetal Wellbeing:  Category II - monitor closely.  Pain Control:  Epidural I/D:  GBS Pos, s/p PCN at 1320, 2nd dose given at 1724  Anticipated MOD:  NSVD   05/10/2021, CNM 05/08/2021, 6:47 PM

## 2021-05-09 ENCOUNTER — Encounter: Payer: Self-pay | Admitting: Obstetrics and Gynecology

## 2021-05-09 LAB — CBC
HCT: 31 % — ABNORMAL LOW (ref 36.0–46.0)
Hemoglobin: 10 g/dL — ABNORMAL LOW (ref 12.0–15.0)
MCH: 25.9 pg — ABNORMAL LOW (ref 26.0–34.0)
MCHC: 32.3 g/dL (ref 30.0–36.0)
MCV: 80.3 fL (ref 80.0–100.0)
Platelets: 368 10*3/uL (ref 150–400)
RBC: 3.86 MIL/uL — ABNORMAL LOW (ref 3.87–5.11)
RDW: 13.9 % (ref 11.5–15.5)
WBC: 23.2 10*3/uL — ABNORMAL HIGH (ref 4.0–10.5)
nRBC: 0 % (ref 0.0–0.2)

## 2021-05-09 MED ORDER — DIBUCAINE (PERIANAL) 1 % EX OINT
1.0000 "application " | TOPICAL_OINTMENT | CUTANEOUS | Status: DC | PRN
  Administered 2021-05-11: 1 via RECTAL
  Filled 2021-05-09: qty 28

## 2021-05-09 MED ORDER — BENZOCAINE-MENTHOL 20-0.5 % EX AERO
1.0000 "application " | INHALATION_SPRAY | CUTANEOUS | Status: DC | PRN
  Filled 2021-05-09: qty 56

## 2021-05-09 MED ORDER — FERROUS SULFATE 325 (65 FE) MG PO TABS
325.0000 mg | ORAL_TABLET | Freq: Two times a day (BID) | ORAL | Status: DC
  Administered 2021-05-09 – 2021-05-11 (×4): 325 mg via ORAL
  Filled 2021-05-09 (×4): qty 1

## 2021-05-09 MED ORDER — COCONUT OIL OIL: 1.0000 "application " | TOPICAL_OIL | Status: DC | PRN

## 2021-05-09 MED ORDER — SENNOSIDES-DOCUSATE SODIUM 8.6-50 MG PO TABS
2.0000 | ORAL_TABLET | Freq: Every day | ORAL | Status: DC
Start: 2021-05-10 — End: 2021-05-11
  Administered 2021-05-10 – 2021-05-11 (×2): 2 via ORAL
  Filled 2021-05-09 (×2): qty 2

## 2021-05-09 MED ORDER — PRENATAL MULTIVITAMIN CH
1.0000 | ORAL_TABLET | Freq: Every day | ORAL | Status: DC
Start: 2021-05-09 — End: 2021-05-11
  Administered 2021-05-09 – 2021-05-10 (×2): 1 via ORAL
  Filled 2021-05-09 (×2): qty 1

## 2021-05-09 MED ORDER — IBUPROFEN 600 MG PO TABS
600.0000 mg | ORAL_TABLET | Freq: Four times a day (QID) | ORAL | Status: DC
  Administered 2021-05-09 – 2021-05-11 (×9): 600 mg via ORAL
  Filled 2021-05-09 (×9): qty 1

## 2021-05-09 MED ORDER — ONDANSETRON HCL 4 MG PO TABS: 4.0000 mg | ORAL_TABLET | ORAL | Status: DC | PRN

## 2021-05-09 MED ORDER — ONDANSETRON HCL 4 MG/2ML IJ SOLN: 4.0000 mg | INTRAMUSCULAR | Status: DC | PRN

## 2021-05-09 MED ORDER — WITCH HAZEL-GLYCERIN EX PADS: 1.0000 "application " | MEDICATED_PAD | CUTANEOUS | Status: DC | PRN

## 2021-05-09 MED ORDER — SIMETHICONE 80 MG PO CHEW: 80.0000 mg | CHEWABLE_TABLET | ORAL | Status: DC | PRN

## 2021-05-09 MED ORDER — ZOLPIDEM TARTRATE 5 MG PO TABS: 5.0000 mg | ORAL_TABLET | Freq: Every evening | ORAL | Status: DC | PRN

## 2021-05-09 MED ORDER — DIPHENHYDRAMINE HCL 25 MG PO CAPS: 25.0000 mg | ORAL_CAPSULE | Freq: Four times a day (QID) | ORAL | Status: DC | PRN

## 2021-05-09 MED ORDER — ACETAMINOPHEN 325 MG PO TABS
650.0000 mg | ORAL_TABLET | ORAL | Status: DC | PRN
Start: 2021-05-09 — End: 2021-05-11
  Administered 2021-05-11: 650 mg via ORAL
  Filled 2021-05-09: qty 2

## 2021-05-09 NOTE — Discharge Summary (Signed)
Obstetrical Discharge Summary  Patient Name: Caroline Mendoza DOB: Apr 29, 1991 MRN: [id number]  Date of Admission: 05/08/2021 Date of Delivery: 05/09/21 Delivered by: Heloise Ochoa CNM Date of Discharge: 05/11/2021  Primary OB: [facility]  [id number] last menstrual period was 08/01/2020 (exact date). EDC Estimated Date of Delivery: 05/14/21 Gestational Age at Delivery: [redacted]w[redacted]d   Antepartum complications:  1. Palpitations, referred to Cards 2. Obesity, BMI 44.9 with excess weight gain, BMI 50.1 on admit  Admitting Diagnosis: morbid obesity Secondary Diagnosis: prolonged active phase, SVD, 2nd perineal laceration, acute blood loss anemia with transfusion  Patient Active Problem List   Diagnosis Date Noted  . Obesity affecting pregnancy in third trimester 05/08/2021  . Hyperlipidemia 05/06/2021  . Pelvic pain in female 04/08/2021  . Pregnancy with uncertain fetal viability 04/08/2021  . Uterine size-date discrepancy in third trimester 04/08/2021  . Elevated random blood glucose level 04/08/2021  . BMI 45.0-49.9, adult (HCC) 04/08/2021  . Patellofemoral arthralgia of right knee 04/08/2021  . Allergic rhinitis 04/08/2021  . Indication for care in labor or delivery 04/07/2021  . 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 Heart palpitations 01/08/2021  . Supervision of other high risk pregnancies, second trimester 09/25/2020  . Unspecified hemorrhoids 03/04/2018    Augmentation: AROM, Pitocin, Cytotec and IP Foley Complications: Hemorrhage>1069mL Intrapartum complications/course: prolonged active phase labor, spontaneous delivery with 2nd deg perineal, brisk bleeding from laceration. Hemostasis achieved with laceration repair, fundus firm with scant bleeding after placenta delivered.  Date of Delivery: 05/09/21 Delivered By: Heloise Ochoa CNM Delivery Type: spontaneous vaginal delivery Anesthesia: epidural Placenta: spontaneous Laceration: 2nd deg  lac Episiotomy: none Newborn Data: Live born female "[name]"  Birth Weight: 7 lb 12.2 oz (3520 g) APGAR: 8, 9  Newborn Delivery   Birth date/time: 05/09/2021 06:39:00 Delivery type: Vaginal, Spontaneous     Postpartum Procedures: transfuse 2 units PRBCs on PPD 1  Edinburgh:  Edinburgh Postnatal Depression Scale Screening Tool 05/09/2021  I have been able to laugh and see the funny side of things. 0  I have looked forward with enjoyment to things. 0  I have blamed myself unnecessarily when things went wrong. 0  I have been anxious or worried for no good reason. 1  I have felt scared or panicky for no good reason. 1  Things have been getting on top of me. 0  I have been so unhappy that I have had difficulty sleeping. 0  I have felt sad or miserable. 0  I have been so unhappy that I have been crying. 0  The thought of harming myself has occurred to me. 0  Edinburgh Postnatal Depression Scale Total 2      Post partum course:  Patient had an uncomplicated postpartum course. Transfusion given PPD1, Hgb 11.8->10.0->7.2 -> tx 2u PRBC -> 9.7  By time of discharge on PPD#2, her pain was controlled on oral pain medications; she had appropriate lochia and was ambulating, voiding without difficulty and tolerating regular diet.  She was deemed stable for discharge to home.    Discharge Physical Exam:  BP 105/68 (BP Location: Right Arm)   Pulse 84   Temp 97.6 F (36.4 C)   Resp 18   Ht 5\' 5"  (1.651 m)   Wt 131.5 kg   LMP 08/01/2020 (Exact Date)   SpO2 100%   Breastfeeding Unknown   BMI 48.26 kg/m   General: NAD CV: RRR Pulm: CTABL, nl effort ABD: s/nd/nt, fundus firm and below the umbilicus Lochia: moderate Perineum: well approximated/intact DVT  Evaluation: LE non-ttp, no evidence of DVT on exam.  Hemoglobin  Date Value Ref Range Status  05/10/2021 9.7 (L) 12.0 - 15.0 g/dL Final    Comment:    REPEATED TO VERIFY   HCT  Date Value Ref Range Status  05/10/2021 29.5 (L)  36.0 - 46.0 % Final    Comment:    Performed at [facility], [address], [place], [place] [address]     Disposition: stable, discharge to home. Baby Feeding: breastmilk and formula Baby Disposition: home with mom  Rh Immune globulin given: n/a Rubella vaccine given: immune Varicella vaccine given: immune Tdap vaccine given in AP or PP setting: 02/26/21 Flu vaccine given in AP or PP setting: 12/18/20  Contraception: Mirena IUD  Prenatal Labs:  Blood type/Rh AB Pos  Antibody screen neg  Rubella Immune  Varicella Immune  RPR NR  HBsAg Neg  HIV NR  GC neg  Chlamydia neg  Genetic screening NegativeMaterniT21, neg AFP  1 hour GTT 166  3 hour GTT [phone]  GBS POS      Plan:  Caroline Mendoza was discharged to home in good condition. Follow-up appointment with delivering provider in 6 weeks.  Discharge Medications: Allergies as of 05/11/2021   No Known Allergies     Medication List    STOP taking these medications   aspirin 81 MG chewable tablet     TAKE these medications   acetaminophen 325 MG tablet Commonly known as: Tylenol Take 2 tablets (650 mg total) by mouth every 4 (four) hours as needed (for pain scale < 4).   ferrous sulfate 325 (65 FE) MG tablet Take 1 tablet (325 mg total) by mouth 2 (two) times daily with a meal.   ibuprofen 600 MG tablet Commonly known as: ADVIL Take 1 tablet (600 mg total) by mouth every 6 (six) hours as needed for mild pain or moderate pain.   PRENATAL VITAMINS PO Take 2 each by mouth daily.        Follow-up Information    McVey, Prudencio Pair, CNM. Schedule an appointment as soon as possible for a visit in 6 week(s).   Specialty: Obstetrics and Gynecology Why: postpartum Contact information: [address] [place] [address] [phone]               Signed:  Al Corpus 05/11/2021  8:36 AM   Chari Manning CNM  Margaretmary Eddy, CNM Certified Nurse  Midwife [facility]

## 2021-05-09 NOTE — Progress Notes (Signed)
Labor Progress Note  Caroline Mendoza is a 30 y.o. G1P0 at [redacted]w[redacted]d by [place] admitted for induction of labor due to morbid obesity.  Subjective: rectal pressure  Objective: BP 137/72   Pulse 92   Temp 98.8 F (37.1 C) (Oral)   Resp 12   Ht 5\' 5"  (1.651 m)   Wt 131.5 kg   LMP 08/01/2020 (Exact Date)   SpO2 96%   BMI 48.26 kg/m  Notable VS details: reviewed.   Fetal Assessment: FHT:  FHR: 115 bpm, variability: moderate,  accelerations:  Present,  decelerations:  Present occasional variables.  Category/reactivity:  Category II UC:   regular, every 1-2 minutes, Pitocin at 3mu/mi SVE:  C/C/+1, caput at +2  Membrane status: AROM at 1402  Amniotic color: clear   Labs: Lab Results  Component Value Date   WBC 14.4 (H) 05/08/2021   HGB 11.8 (L) 05/08/2021   HCT 36.1 05/08/2021   MCV 81.1 05/08/2021   PLT 383 05/08/2021    Assessment / Plan: IOL at 39.1wks, morbid obesity  - Prolonged active phase, making slow but adequate cervical change throughout the night.   Labor: cytotec x 2 doses, Cook cath and pitocin. S/p AROM, IUPC, FSE. Continue to titrate Pitocin, IUPC still not working well.  Preeclampsia:  no e/o Pre-E Fetal Wellbeing:  Category II - monitor closely.  Pain Control:  Epidural I/D:  GBS Pos, s/p PCN with adequate treatment.   Anticipated MOD:  NSVD   05/10/2021, CNM 05/09/2021, 6:24 AM

## 2021-05-09 NOTE — Progress Notes (Signed)
Labor Progress Note  Caroline Mendoza is a 30 y.o. G1P0 at [redacted]w[redacted]d by [place] admitted for induction of labor due to morbid obesity.  Subjective: intermittent rectal pressure noted.   Objective: BP 127/64   Pulse 87   Temp 99.2 F (37.3 C) (Oral)   Resp 12   Ht 5\' 5"  (1.651 m)   Wt 131.5 kg   LMP 08/01/2020 (Exact Date)   SpO2 95%   BMI 48.26 kg/m  Notable VS details: reviewed.   Fetal Assessment: FHT:  FHR: 115 bpm, variability: moderate,  accelerations:  Present,  decelerations:  Present occasional variables.  Category/reactivity:  Category II UC:   regular, every 1-2 minutes, Pitocin at 29mu/min; MVUs inadequate, 135-180, IUPC adjusted, re-zeroed and flushed. Reading clearly with MVUs during walchers position x 11m: 170 MVU SVE:  8/90/0, caput to +1, normal bloody show.   Membrane status: AROM at 1402  Amniotic color: clear   Labs: Lab Results  Component Value Date   WBC 14.4 (H) 05/08/2021   HGB 11.8 (L) 05/08/2021   HCT 36.1 05/08/2021   MCV 81.1 05/08/2021   PLT 383 05/08/2021    Assessment / Plan: IOL at 39.1wks, morbid obesity  Labor: cytotec x 2 doses, Cook cath and pitocin. S/p AROM, IUPC, FSE. Cervical change since last exam with descent, continue to titrate Pitocin, IUPC positional and working well in some positions only despite flush, rezero and pulling back.  Preeclampsia:  no e/o Pre-E Fetal Wellbeing:  Category II - monitor closely.  Pain Control:  Epidural I/D:  GBS Pos, s/p PCN x 3 doses. Next dose due  Anticipated MOD:  NSVD   05/10/2021, CNM 05/09/2021, 1:04 AM

## 2021-05-09 NOTE — Lactation Note (Addendum)
This note was copied from a baby's chart. Lactation Consultation Note  Patient Name: Caroline Mendoza [id number] Date: 05/09/2021 Reason for consult: Follow-up assessment;Mother's request;Primapara;Term;Other (Comment) (Falls asleep at the breast - Mom requested pump be set up) Age:28 hours  Maternal Data Has patient been taught Hand Expression?: Yes Does the patient have breastfeeding experience prior to this delivery?: No (First baby)  Feeding Mother's Current Feeding Choice: Breast Milk  Caroline Mendoza has been going to the breast frequently, but falls asleep after 3 to 5 minutes usually.  Caroline Mendoza has a tight frenulum which Pediatrician is aware of. At f/u pediatric appointment tight frenulum will be reassessed and potentially be clipped.  Information sheet also given with Pediatricians that will repair with lazer. Caroline Mendoza can extend his tongue to the gum line and move side to side.  Mom denies any severe nipple pain. Mom requested to pump.  Explained she would probably not get any colostrum with pumping, but demonstrated that she could hand express.  Symphony set up with instructions in massage, hand expression, pumping using manual and electric pump, collection, storage, cleaning and handling of expressed milk.  Mom only wanted to pump for a few minutes to see how it worked while Caroline Mendoza was skin to skin with FOB.  In that small time period of pumping we saw a few drops along the flange of the pump.  Discouraged a lot of pumping at this time and that it was better to keep putting him back to the breast whenever he demonstrated feeding cues.  When we finished pumping Caroline Mendoza was demonstrating feeding cues which were pointed out to parents and grandmother.  [name] went to the breast for 3 to 4 minutes of actual sucking.  Demonstrated how to massage breast and gently stimulate him to keep him actively sucking at the breast.  Hand outs given on what to expect with breast feeding the first 4 days of  life and reviewed newborn stomach size, normal course of lactation, supply and demand, adequate intake and out put and routine newborn feeding patterns.  Lactation Resource sheet given and reviewed support groups, informational web sites and contact numbers.  Lactation name and number has been written on white board and encouraged to call with ay questions, concerns or assistance.  LATCH Score Latch: Repeated attempts needed to sustain latch, nipple held in mouth throughout feeding, stimulation needed to elicit sucking reflex.  Audible Swallowing: A few with stimulation  Type of Nipple: Everted at rest and after stimulation  Comfort (Breast/Nipple): Filling, red/small blisters or bruises, mild/mod discomfort  Hold (Positioning): Assistance needed to correctly position infant at breast and maintain latch.  LATCH Score: 6   Lactation Tools Discussed/Used Tools: Pump;Other (comment) (Medela lanolin - set up Symphony in room & demonstrated manual use as well) Breast pump type: Double-Electric Breast Pump;Manual (Demonstrated both manual and electric use of pump) Pump Education: Setup, frequency, and cleaning;Milk Storage;Other (comment) Reason for Pumping: Mom requested Pumping frequency: Encouraged mom to just breast feed for now Pumped volume:  (Drops in flange of pump - got more with hand expression)  Interventions Interventions: Breast feeding basics reviewed;Assisted with latch;Skin to skin;Breast massage;Hand express;Pre-pump if needed;Breast compression;Adjust position;Reverse pressure;Support pillows;Position options;DEBP;Hand pump;Education  Discharge Discharge Education: Engorgement and breast care;Warning signs for feeding baby;Other (comment) Pump: Personal;DEBP;Advised to call insurance company (Set up Symphony in room - also got medela pump at shower) WIC Program: No  Consult Status Consult Status: Follow-up Follow-up type: Call as needed    Carlyle Basques  Joyce Gross 05/09/2021, 5:59 PM

## 2021-05-10 LAB — CBC
HCT: 22.4 % — ABNORMAL LOW (ref 36.0–46.0)
Hemoglobin: 7.2 g/dL — ABNORMAL LOW (ref 12.0–15.0)
MCH: 26.5 pg (ref 26.0–34.0)
MCHC: 32.1 g/dL (ref 30.0–36.0)
MCV: 82.4 fL (ref 80.0–100.0)
Platelets: 264 10*3/uL (ref 150–400)
RBC: 2.72 MIL/uL — ABNORMAL LOW (ref 3.87–5.11)
RDW: 14.1 % (ref 11.5–15.5)
WBC: 17.9 10*3/uL — ABNORMAL HIGH (ref 4.0–10.5)
nRBC: 0 % (ref 0.0–0.2)

## 2021-05-10 LAB — HEMOGLOBIN AND HEMATOCRIT, BLOOD
HCT: 29.5 % — ABNORMAL LOW (ref 36.0–46.0)
Hemoglobin: 9.7 g/dL — ABNORMAL LOW (ref 12.0–15.0)

## 2021-05-10 LAB — PREPARE RBC (CROSSMATCH)

## 2021-05-10 MED ORDER — DIPHENHYDRAMINE HCL 25 MG PO CAPS
25.0000 mg | ORAL_CAPSULE | Freq: Once | ORAL | Status: AC
  Administered 2021-05-10: 25 mg via ORAL
  Filled 2021-05-10: qty 1

## 2021-05-10 MED ORDER — ACETAMINOPHEN 325 MG PO TABS
650.0000 mg | ORAL_TABLET | Freq: Once | ORAL | Status: AC
  Administered 2021-05-10: 650 mg via ORAL
  Filled 2021-05-10: qty 2

## 2021-05-10 MED ORDER — SODIUM CHLORIDE 0.9% IV SOLUTION: Freq: Once | INTRAVENOUS | Status: AC

## 2021-05-10 NOTE — Progress Notes (Signed)
Post Partum Day 1 Subjective: Doing well, no complaints.  Tolerating regular diet, pain with PO meds, voiding and ambulating without difficulty.  No CP SOB Fever,Chills, N/V or leg pain; denies nipple or breast pain no HA change of vision, RUQ/epigastric pain  Objective: BP 114/70 (BP Location: Right Arm)   Pulse 88   Temp 97.6 F (36.4 C) (Oral)   Resp 20   Ht 5\' 5"  (1.651 m)   Wt 131.5 kg   LMP 08/01/2020 (Exact Date)   SpO2 97%   Breastfeeding Unknown   BMI 48.26 kg/m    Physical Exam:  General: NAD Breasts: soft/nontender CV: RRR Pulm: nl effort, CTABL Abdomen: soft, NT, BS x 4 Perineum: moderate edema, repair well approximated Lochia: small Uterine Fundus: fundus firm and 2 fb below umbilicus DVT Evaluation: no cords, ttp LEs   Recent Labs    05/09/21 0739 05/10/21 0323  HGB 10.0* 7.2*  HCT 31.0* 22.4*  WBC 23.2* 17.9*  PLT 368 264    Assessment/Plan: 30 y.o. G1P1001 postpartum day # 1  - Continue routine PP care - Lactation consult prn - Discussed contraceptive options including implant, IUDs hormonal and non-hormonal, injection, pills/ring/patch, condoms, and NFP.  - Acute blood loss anemia - slightly dizzy and very tired, discussed R/B/A of transfusion, pt agrees to PRBC transfusion.  start po ferrous sulfate BID with stool softeners  - Immunization status: all Imms up to date    Disposition: Does not desire Dc home today.     05/12/21, CNM 05/10/2021  8:49 AM

## 2021-05-10 NOTE — Anesthesia Postprocedure Evaluation (Signed)
Anesthesia Post Note  Patient: Caroline Mendoza  Procedure(s) Performed: AN AD HOC LABOR EPIDURAL  Patient location during evaluation: Mother Baby Anesthesia Type: Epidural Level of consciousness: awake and alert Pain management: pain level controlled Vital Signs Assessment: post-procedure vital signs reviewed and stable Respiratory status: spontaneous breathing, nonlabored ventilation and respiratory function stable Cardiovascular status: stable Postop Assessment: no headache, no backache and epidural receding Anesthetic complications: no   No complications documented.   Last Vitals:  Vitals:   05/09/21 2330 05/10/21 0825  BP: 137/71 114/70  Pulse: 97 88  Resp: 18 20  Temp: 37.3 C 36.4 C  SpO2: 99% 97%    Last Pain:  Vitals:   05/10/21 0825  TempSrc: Oral  PainSc:                  Corinda Gubler

## 2021-05-10 NOTE — Lactation Note (Signed)
This note was copied from a baby's chart. Lactation Consultation Note  Patient Name: Caroline Mendoza [id number]'[id number] Date: 05/10/2021 Reason for consult: Follow-up assessment;Mother's request;Difficult latch;Primapara;Term;Hyperbilirubinemia;Other (Comment) (Supplementing via SNS d/t hyperbilirubinemia - Caroline Mendoza also has tight frenulum) Age:28 hours  Maternal Data Has patient been taught Hand Expression?: Yes Does the patient have breastfeeding experience prior to this delivery?: No (This is her first pregnancy and first baby)  Feeding Mother's Current Feeding Choice: Breast Milk and Formula  Caroline Mendoza is on triple phototherapy d/t elevated bilirubin.  Pediatrician has ordered that [facility] breast feed and supplement with breast milk, DBM or formula every 3 hours.  Caroline Mendoza has been sleepy since his circumcision possibly from the circ but also could be d/t jaundice.  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  Mom has large breasts and small nipples that point downward which is why mom struggles with the latch sometimes and still needs help especially since the SNS for supplementation has been introduced.  Discussed all feeding options with parents.  Mom chose to supplement with formula via SNS at the breast and pump after breast feeding.  The first feeding LC assisted with was on the right breast in the sidelying position.  Mom pumped after breast feeding and got 5 ml colostrum.  Caroline Mendoza fed for 20 minutes and took 15 ml of the Similac 20 calorie formula.  For the 5:56 pm breast feed, we positioned Caroline Mendoza in the football hold with SNS at the breast.  Just like with the first breast, he was on and off the breast pushing away at times.  Once the #20 nipple shield was placed on the breast, he was able to sustain the latch for longer intervals.  First we put some of mom's previously pumped colostrum in  the tip of the nipple shield and then along side of the nipple shield until he took all of the 5 ml of breast milk that she pumped.  We then taped the SNS along side of the nipple shield.  At first he would move around and kept losing the tubing.  Once we put the tubing inside of the nipple shield, he began strong, rhythmic sucking and took 17 ml of the Similac 20 calorie formula and remained at the breast for 19 minutes before pushing it out with his tongue and fell asleep.  Praised mom for her commitment to continue to breast feed and pump for [employer].  Encouraged mom to call with any questions, concerns or assistance.   LATCH Score Latch: Repeated attempts needed to sustain latch, nipple held in mouth throughout feeding, stimulation needed to elicit sucking reflex.  Audible Swallowing: Spontaneous and intermittent  Type of Nipple: Everted at rest and after stimulation (Large breast and small nipple)  Comfort (Breast/Nipple): Filling, red/small blisters or bruises, mild/mod discomfort  Hold (Positioning): Assistance needed to correctly position infant at breast and maintain latch.  LATCH Score: 7   Lactation Tools Discussed/Used Tools: Pump;Nipple Dorris Carnes;Other (comment) (Curved tip syringe at the breast & SNS along side of nipple shield at the breast) Nipple shield size: 20 Breast pump type: Double-Electric Breast Pump Pump Education: Setup, frequency, and cleaning;Milk Storage;Other (comment) Reason for Pumping: Supplementation ordered by Pediatrician Pumping frequency: Encouraged to breast feed or pump every 3 hours when supplement  Interventions Interventions: Breast feeding basics reviewed;Assisted with latch;Skin to skin;Breast massage;Hand express;Pre-pump if needed;Reverse pressure;Breast compression;Adjust position;Support pillows;Position  options;Expressed milk;Comfort gels;DEBP;Education  Discharge Discharge Education: Other (comment);Warning signs for feeding baby Pump:  DEBP;Advised to call insurance company [facility] Program: No  Consult Status Consult Status: Follow-up Follow-up type: Call as needed    Louis Meckel 05/10/2021, 7:34 PM

## 2021-05-10 NOTE — Lactation Note (Signed)
This note was copied from a baby's chart. Lactation Consultation Note  Patient Name: Caroline Mendoza [id number] Date: 05/10/2021 Reason for consult: Follow-up assessment;Mother's request;Primapara;Term;Other (Comment) (Has not breast fed in 5 hours and is going for circumcision and is sleepy already) Age:28 hours  Maternal Data Has patient been taught Hand Expression?: Yes Does the patient have breastfeeding experience prior to this delivery?: No (First baby)  Feeding Mother's Current Feeding Choice: Breast Milk  Caroline Mendoza is sleepy and refusing to latch.  Placed him skin to skin with mom while she was getting an IV started.  Caroline Mendoza has a tight frenulum which the pediatrician is aware of.  He will be followed up at the Pediatrician's office for probably clipping of the frenulum.  Information has been given to the parents.  Caroline Mendoza has been sleeping for 5 hours without a breast feed.  He is scheduled for a circumcision this am so would like for him to get a good breast feed in before the circumcision and explained why to the parents. They are generally sleepy for around 6 hours after a circumcision and will sometimes refuse to breast feed.  15 minutes later he was giving subtle feeding cues, but would fall back to sleep when we would get him back on the left breast.  Could easily hand express and got 1 1/2 tsp colostrum.  Gently aroused Caroline Mendoza to spoon feed 1 1/2 tsp of colostrum.  He started licking his lips.  Put him back to the left breast in an upright position.  He latched after a couple of tries and began strong, rhythmic sucking with audible swallows.  Massaging of the breast and gentle touching of the chin kept him actively sucking for 12 minutes.  Mom was pleased.  Caroline Mendoza was satiated after the feeding.  Placed him back in the crib for mom to get up to the bathroom.  Mom's nipples are getting more tender.  No skin break down is noted.  Lanolin has already been given to mom.  Comfort  gels given and instructed in how to alternate use. Praised mom for her patience and commitment with breast feeding.  Lactation name and number is still on the white board and encouraged mom to call with any questions, concerns or if needs assistance after circumcision.      LATCH Score Latch: Repeated attempts needed to sustain latch, nipple held in mouth throughout feeding, stimulation needed to elicit sucking reflex.  Audible Swallowing: Spontaneous and intermittent  Type of Nipple: Everted at rest and after stimulation  Comfort (Breast/Nipple): Filling, red/small blisters or bruises, mild/mod discomfort  Hold (Positioning): Assistance needed to correctly position infant at breast and maintain latch.  LATCH Score: 7   Lactation Tools Discussed/Used Tools: Other (comment) (Spoon fed expressed colostrum)  Interventions Interventions: Breast feeding basics reviewed;Assisted with latch;Skin to skin;Breast massage;Hand express;Reverse pressure;Breast compression;Adjust position;Support pillows;Position options;Expressed milk;Comfort gels;Education  Discharge Discharge Education: Engorgement and breast care;Warning signs for feeding baby;Other (comment) Pump: DEBP;Advised to call insurance company (Symphony set up in room with manual and electric instructions.  Mom reports that her [employer] company refused her a DEBP - Encouraged mom to call again) WIC Program: No (Has [employer])  Consult Status Consult Status: PRN Follow-up type: Call as needed    Louis Meckel 05/10/2021, 11:46 AM

## 2021-05-11 LAB — BPAM RBC
Blood Product Expiration Date: 202206142359
Blood Product Expiration Date: 202206212359
ISSUE DATE / TIME: 202205301230
ISSUE DATE / TIME: 202205301542
Unit Type and Rh: 6200
Unit Type and Rh: 6200

## 2021-05-11 LAB — TYPE AND SCREEN
ABO/RH(D): AB POS
Antibody Screen: NEGATIVE
Unit division: 0
Unit division: 0

## 2021-05-11 MED ORDER — IBUPROFEN 600 MG PO TABS: 600.0000 mg | ORAL_TABLET | Freq: Four times a day (QID) | ORAL | 0 refills | Status: DC | PRN

## 2021-05-11 MED ORDER — FERROUS SULFATE 325 (65 FE) MG PO TABS: 325.0000 mg | ORAL_TABLET | Freq: Two times a day (BID) | ORAL | 3 refills | Status: DC

## 2021-05-11 MED ORDER — ACETAMINOPHEN 325 MG PO TABS: 650.0000 mg | ORAL_TABLET | ORAL | Status: DC | PRN

## 2021-05-11 NOTE — Discharge Instructions (Signed)
How to Take a [employer] A sitz bath is a warm water bath that may be used to care for your rectum, genital area, or the area between your rectum and genitals (perineum). In a sitz bath, the water only comes up to your hips and covers your buttocks. A sitz bath may be done in a bathtub or with a portable sitz bath that fits over the toilet. Your health care provider may recommend a sitz bath to help:  Relieve pain and discomfort after delivering a baby.  Relieve pain and itching from hemorrhoids or anal fissures.  Relieve pain after certain surgeries.  Relax muscles that are sore or tight. How to take a sitz bath Take 3-4 sitz baths a day, or as many as told by your health care provider. Bathtub sitz bath To take a sitz bath in a bathtub: 1. Partially fill a bathtub with warm water. The water should be deep enough to cover your hips and buttocks when you are sitting in the tub. 2. Follow your health care provider's instructions if you are told to put medicine in the water. 3. Sit in the water. Open the tub drain a little, and leave it open during your bath. 4. Turn on the warm water again, enough to replace the water that is draining out. Keep the water running throughout your bath. This helps keep the water at the right level and temperature. 5. Soak in the water for 15-20 minutes, or as long as told by your health care provider. 6. When you are done, be careful when you stand up. You may feel dizzy. 7. After the sitz bath, pat yourself dry. Do not rub your skin to dry it.   Over-the-toilet sitz bath To take a sitz bath with an over-the-toilet basin: 1. Follow the manufacturer's instructions. 2. Fill the basin with warm water. 3. Follow your health care provider's instructions if you were told to put medicine in the water. 4. Sit on the seat. Make sure the water covers your buttocks and perineum. 5. Soak in the water for 15-20 minutes, or as long as told by your health care  provider. 6. After the sitz bath, pat yourself dry. Do not rub your skin to dry it. 7. Clean and dry the basin between uses. 8. Discard the basin if it cracks, or according to the manufacturer's instructions.   Contact a health care provider if:  Your pain or itching gets worse. Do not continue with sitz baths if your symptoms get worse.  You have new symptoms. Do not continue with sitz baths until you talk with your health care provider. Summary  A sitz bath is a warm water bath in which the water only comes up to your hips and covers your buttocks.  A sitz bath may help relieve pain and discomfort after delivering a baby. It also may help with pain and itching from hemorrhoids or anal fissures, or pain after certain surgeries. It can also help to relax muscles that are sore or tight.  Take 3-4 sitz baths a day, or as many as told by your health care provider. Soak in the water for 15-20 minutes.  Do not continue with sitz baths if your symptoms get worse. This information is not intended to replace advice given to you by your health care provider. Make sure you discuss any questions you have with your health care provider. Document Revised: 08/13/2020 Document Reviewed: 08/13/2020 Elsevier Patient Education  2021 [employer] Postpartum Care After Vaginal  Delivery The following information offers guidance about how to care for yourself from the time you deliver your baby to 6-12 weeks after delivery (postpartum period). If you have problems or questions, contact your health care provider for more specific instructions. Follow these instructions at home: Vaginal bleeding  It is normal to have vaginal bleeding (lochia) after delivery. Wear a sanitary pad for bleeding and discharge. ? During the first week after delivery, the amount and appearance of lochia is often similar to a menstrual period. ? Over the next few weeks, it will gradually decrease to a dry, yellow-brown  discharge. ? For most women, lochia stops completely by 4-6 weeks after delivery, but can vary.  Change your sanitary pads frequently. Watch for any changes in your flow, such as: ? A sudden increase in volume. ? A change in color. ? Large blood clots.  If you pass a blood clot from your vagina, save it and call your health care provider. Do not flush blood clots down the toilet before talking with your health care provider.  Do not use tampons or douches until your health care provider approves.  If you are not breastfeeding, your period should return 6-8 weeks after delivery. If you are feeding your baby breast milk only, your period may not return until you stop breastfeeding. Perineal care  Keep the area between the vagina and the anus (perineum) clean and dry. Use medicated pads and pain-relieving sprays and creams as directed.  If you had a surgical cut in the perineum (episiotomy) or a tear, check the area for signs of infection until you are healed. Check for: ? More redness, swelling, or pain. ? Fluid or blood coming from the cut or tear. ? Warmth. ? Pus or a bad smell.  You may be given a squirt bottle to use instead of wiping to clean the perineum area after you use the bathroom. Pat the area gently to dry it.  To relieve pain caused by an episiotomy, a tear, or swollen veins in the anus (hemorrhoids), take a warm sitz bath 2-3 times a day. In a sitz bath, the warm water should only come up to your hips and cover your buttocks.   Breast care  In the first few days after delivery, your breasts may feel heavy, full, and uncomfortable (breast engorgement). Milk may also leak from your breasts. Ask your health care provider about ways to help relieve the discomfort.  If you are breastfeeding: ? Wear a bra that supports your breasts and fits well. Use breast pads to absorb milk that leaks. ? Keep your nipples clean and dry. Apply creams and ointments as told. ? You may have  uterine contractions every time you breastfeed for up to several weeks after delivery. This helps your uterus return to its normal size. ? If you have any problems with breastfeeding, notify your health care provider or lactation consultant.  If you are not breastfeeding: ? Avoid touching your breasts. Do not squeeze out (express) milk. Doing this can make your breasts produce more milk. ? Wear a good-fitting bra and use cold packs to help with swelling. Intimacy and sexuality  Ask your health care provider when you can engage in sexual activity. This may depend upon: ? Your risk of infection. ? How fast you are healing. ? Your comfort and desire to engage in sexual activity.  You are able to get pregnant after delivery, even if you have not had your period. Talk with your health care  provider about methods of birth control (contraception) or family planning if you desire future pregnancies. Medicines  Take over-the-counter and prescription medicines only as told by your health care provider.  Take an over-the-counter stool softener to help ease bowel movements as told by your health care provider.  If you were prescribed an antibiotic medicine, take it as told by your health care provider. Do not stop taking the antibiotic even if you start to feel better.  Review all previous and current prescriptions to check for possible transfer into breast milk. Activity  Gradually return to your normal activities as told by your health care provider.  Rest as much as possible. Nap while your baby is sleeping. Eating and drinking  Drink enough fluid to keep your urine pale yellow.  To help prevent or relieve constipation, eat high-fiber foods every day.  Choose healthy eating to support breastfeeding or weight loss goals.  Take your prenatal vitamins until your health care provider tells you to stop.   General tips/recommendations  Do not use any products that contain nicotine or tobacco.  These products include cigarettes, chewing tobacco, and vaping devices, such as e-cigarettes. If you need help quitting, ask your health care provider.  Do not drink alcohol, especially if you are breastfeeding.  Do not take medications or drugs that are not prescribed to you, especially if you are breastfeeding.  Visit your health care provider for a postpartum checkup within the first 3-6 weeks after delivery.  Complete a comprehensive postpartum visit no later than 12 weeks after delivery.  Keep all follow-up visits for you and your baby. Contact a health care provider if:  You feel unusually sad or worried.  Your breasts become red, painful, or hard.  You have a fever or other signs of an infection.  You have bleeding that is soaking through one pad an hour or you have blood clots.  You have a severe headache that doesn't go away or you have vision changes.  You have nausea and vomiting and are unable to eat or drink anything for 24 hours. Get help right away if:  You have chest pain or difficulty breathing.  You have sudden, severe leg pain.  You faint or have a seizure.  You have thoughts about hurting yourself or your baby. If you ever feel like you may hurt yourself or others, or have thoughts about taking your own life, get help right away. Go to your nearest emergency department or:  Call your local emergency services (911 in the [place]  The National Suicide Prevention Lifeline at 858-238-2852. This suicide crisis helpline is open 24 hours a day.  Text the Crisis Text Line at (815)834-5670 (in the [place] Summary  The period of time after you deliver your newborn up to 6-12 weeks after delivery is called the postpartum period.  Keep all follow-up visits for you and your baby.  Review all previous and current prescriptions to check for possible transfer into breast milk.  Contact a health care provider if you feel unusually sad or worried during the postpartum  period. This information is not intended to replace advice given to you by your health care provider. Make sure you discuss any questions you have with your health care provider. Document Revised: 08/13/2020 Document Reviewed: 08/13/2020 Elsevier Patient Education  2021 [employer].

## 2021-05-11 NOTE — Lactation Note (Signed)
This note was copied from a baby's chart. Lactation Consultation Note  Patient Name: Caroline Mendoza YFVCB'S Date: 05/11/2021 Reason for consult: Follow-up assessment;Term Age:28 hours  Lactation follow-up before anticipated discharge. Mom utilized feeding tools (SNS, Nipple Shield, Pump, Bottle) yesterday to improve feedings with baby [name].   Mom voices that she may BF, pump, and bottle feed once she is home, citing that the SNS and NS are "a lot of work, especially during the night". LC reviewed with parents a feeding plan, and importance of stimulation/milk removal via baby or pump at least every 3 hours. LC discussed milk supply and demand and normal course of lactation with the anticipation of colostrum to transitional and mature milk within days 3-5 PP.   Baby has a pediatrician follow-up appointment tomorrow where the plan is to follow-up re: tongue-tie concerns, and decide on treatment plan.  LC provided guidance on newborn stomach size, feeding patterns and behaviors, growth spurts, output expectations, and paced-bottle feeding. Information provided to mom on anticipated breast changes, management of breast fullness and engorgement and nipple care. Outpatient lactation information given along with community breastfeeding support services available to family. Encouraged to call with questions and for ongoing BF support as needed.  Maternal Data Has patient been taught Hand Expression?: Yes Does the patient have breastfeeding experience prior to this delivery?: No  Feeding Mother's Current Feeding Choice: Breast Milk Nipple Type: Slow - flow  LATCH Score Latch: Grasps breast easily, tongue down, lips flanged, rhythmical sucking.  Audible Swallowing: A few with stimulation  Type of Nipple: Everted at rest and after stimulation  Comfort (Breast/Nipple): Filling, red/small blisters or bruises, mild/mod discomfort  Hold (Positioning): No assistance needed to  correctly position infant at breast.  LATCH Score: 8   Lactation Tools Discussed/Used Tools: Pump;Bottle;Supplemental Nutrition System;Nipple Shields Nipple shield size: 16 Breast pump type: Double-Electric Breast Pump Reason for Pumping: tongue tie  Interventions Interventions: Breast feeding basics reviewed;Education (feeding plan)  Discharge Discharge Education: Engorgement and breast care;Warning signs for feeding baby;Outpatient recommendation Pump: Personal;DEBP  Consult Status Consult Status: Complete Date: 05/11/21 Follow-up type: Call as needed    Danford Bad 05/11/2021, 9:50 AM

## 2021-05-11 NOTE — Progress Notes (Signed)
Pt discharged with infant.  Discharge instructions, prescriptions and follow up appointment given to and reviewed with pt. Pt verbalized understanding. Escorted out by auxillary. 

## 2021-05-14 LAB — SURGICAL PATHOLOGY

## 2021-06-07 ENCOUNTER — Ambulatory Visit (INDEPENDENT_AMBULATORY_CARE_PROVIDER_SITE_OTHER): Payer: 59 | Admitting: Family Medicine

## 2021-06-07 ENCOUNTER — Other Ambulatory Visit: Payer: Self-pay

## 2021-06-07 ENCOUNTER — Encounter: Payer: Self-pay | Admitting: Family Medicine

## 2021-06-07 VITALS — BP 118/82 | HR 84 | Temp 98.5°F | Ht 65.0 in | Wt 265.0 lb

## 2021-06-07 DIAGNOSIS — J3089 Other allergic rhinitis: Secondary | ICD-10-CM

## 2021-06-07 DIAGNOSIS — R1033 Periumbilical pain: Secondary | ICD-10-CM | POA: Diagnosis not present

## 2021-06-07 DIAGNOSIS — M76821 Posterior tibial tendinitis, right leg: Secondary | ICD-10-CM

## 2021-06-07 DIAGNOSIS — E785 Hyperlipidemia, unspecified: Secondary | ICD-10-CM

## 2021-06-07 DIAGNOSIS — M25561 Pain in right knee: Secondary | ICD-10-CM | POA: Diagnosis not present

## 2021-06-07 MED ORDER — ESOMEPRAZOLE MAGNESIUM 40 MG PO CPDR: 40.0000 mg | DELAYED_RELEASE_CAPSULE | Freq: Every day | ORAL | 0 refills | Status: DC

## 2021-06-07 NOTE — Assessment & Plan Note (Signed)
Atraumatic right lower extremity foot/ankle to lower third of right lower leg pain for roughly the past week.  Treatments have included rest and massage with benefit.  She denies any calf pain or swelling, no skin discoloration inclusive of bruising.  Physical exam shows tenderness tracking along the posterior tibialis muscle, Homans' sign is negative, calf is nontender and supple.  Provocative testing is consistent with the same.  I have advised relative rest, heat, massage, and home exercises.  She will follow-up on an as needed basis for this issue.

## 2021-06-07 NOTE — Progress Notes (Signed)
Primary Care / Sports Medicine Office Visit  Patient Information:  Patient ID: Caroline Mendoza, female DOB: 1991/05/23 Age: 30 y.o. MRN: [id number]   Caroline Mendoza is a pleasant 30 y.o. female presenting with the following:  Chief Complaint  Patient presents with   Follow-up   Patellofemoral arthralgia of right knee    Resolved since giving birth; no pain in office today   Allergic Rhinitis     Resolved   Umbilical pain    When eating; irregular bowel movements; patient is breastfeeding; started last week; 10/10 pain on occurence    Review of Systems pertinent details above   Patient Active Problem List   Diagnosis Date Noted   Periumbilical abdominal pain 06/07/2021   Posterior tibial tendinitis, right 06/07/2021   Obesity affecting pregnancy in third trimester 05/08/2021   Hyperlipidemia 05/06/2021   Pelvic pain in female 04/08/2021   Pregnancy with uncertain fetal viability 04/08/2021   Uterine size-date discrepancy in third trimester 04/08/2021   Elevated random blood glucose level 04/08/2021   BMI 45.0-49.9, adult (HCC) 04/08/2021   Patellofemoral arthralgia of right knee 04/08/2021   Allergic rhinitis 04/08/2021   Indication for care in labor or delivery 04/07/2021   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   Prenatal Vit-Fe Fumarate-FA (PRENATAL VITAMINS PO) Take 2 each by mouth daily.   acetaminophen (TYLENOL) 325 MG tablet Take 2 tablets (650 mg total) by mouth every 4 (four) hours as needed (for pain scale < 4).   [DISCONTINUED] ferrous sulfate 325 (65 FE) MG  tablet Take 1 tablet (325 mg total) by mouth 2 (two) times daily with a meal.   [DISCONTINUED] ibuprofen (ADVIL) 600 MG tablet Take 1 tablet (600 mg total) by mouth every 6 (six) hours as needed for mild pain or moderate pain.   No facility-administered encounter medications on file as of 06/07/2021.   Past Surgical History:  Procedure Laterality Date   WISDOM TOOTH EXTRACTION      Vitals:   06/07/21 1324  BP: 118/82  Pulse: 84  Temp: 98.5 F (36.9 C)  SpO2: 99%   Vitals:   06/07/21 1324  Weight: 265 lb (120.2 kg)  Height: 5\' 5"  (1.651 m)   Body mass index is 44.1 kg/m.  No results found.   Independent interpretation of notes and tests performed by another provider:   None  Procedures performed:   None  Pertinent History, Exam, Impression, and Recommendations:   Posterior tibial tendinitis, right Atraumatic right lower extremity foot/ankle to lower third of right lower leg pain for roughly the past week.  Treatments have included rest and massage with benefit.  She denies any calf pain or swelling, no skin discoloration inclusive of bruising.  Physical exam shows tenderness tracking along the posterior tibialis muscle, Homans' sign is negative, calf is nontender and supple.  Provocative testing is consistent with the same.  I have advised relative rest, heat, massage, and home exercises.  She will follow-up on an as needed basis for this issue.  Periumbilical abdominal pain This is a new problem with uncertain prognosis, periumbilical pain for a little over 1 week associated  with severe pain in the periumbilical region after meals.  She did have initial bout of multiple episodes of diarrhea at onset.  Denies any sick contacts and the only change in her dietary pattern is less eating out.  No specific foods elicit this response, questionable symptoms in a reflux pattern.  Physical exam reveals a soft nontender and nondistended abdomen with normoactive bowel sounds, no  tenderness in the right upper, left upper, or lower quadrants.  Direct pressure in the epigastrium and periumbilical region are equivocal, no rebound or masses appreciated.  Etiology differential can include gastritis, GERD, viral GE, biliary dysfunction.  At this stage I have advised symptomatic treatment with OTC calcium carbonate, progression to PPI if okay with her pediatrician as she is breast-feeding, and as needed Zofran again if approved by her pediatrician.  If symptoms persist over the next 2 weeks despite these measures, serum/imaging studies to be coordinated to further evaluate her gallbladder.  She was advised to contact her office in 2 weeks time if still symptomatic.  Patellofemoral arthralgia of right knee This is a chronic issue that has resolved following recent delivery, no recurrence noted, we can monitor this issue on an as-needed basis.  Hyperlipidemia She has started lifestyle interventions inclusive of limited eating out, healthier eating at home, plan to recheck lipids in 6 months time prior to her follow-up.  Allergic rhinitis Is a chronic issue that has stabilized and she denies any significant symptoms since the delivery of her son.  We can track this issue on an as-needed basis.    Orders & Medications Meds ordered this encounter  Medications   esomeprazole (NEXIUM) 40 MG capsule    Sig: Take 1 capsule (40 mg total) by mouth daily.    Dispense:  30 capsule    Refill:  0   No orders of the defined types were placed in this encounter.    Return in about 18 weeks (around 10/11/2021) for Annual physical.     Jerrol Banana, MD   Primary Care Sports Medicine [facility]

## 2021-06-07 NOTE — Assessment & Plan Note (Signed)
This is a chronic issue that has resolved following recent delivery, no recurrence noted, we can monitor this issue on an as-needed basis.

## 2021-06-07 NOTE — Assessment & Plan Note (Signed)
This is a new problem with uncertain prognosis, periumbilical pain for a little over 1 week associated with severe pain in the periumbilical region after meals.  She did have initial bout of multiple episodes of diarrhea at onset.  Denies any sick contacts and the only change in her dietary pattern is less eating out.  No specific foods elicit this response, questionable symptoms in a reflux pattern.  Physical exam reveals a soft nontender and nondistended abdomen with normoactive bowel sounds, no tenderness in the right upper, left upper, or lower quadrants.  Direct pressure in the epigastrium and periumbilical region are equivocal, no rebound or masses appreciated.  Etiology differential can include gastritis, GERD, viral GE, biliary dysfunction.  At this stage I have advised symptomatic treatment with OTC calcium carbonate, progression to PPI if okay with her pediatrician as she is breast-feeding, and as needed Zofran again if approved by her pediatrician.  If symptoms persist over the next 2 weeks despite these measures, serum/imaging studies to be coordinated to further evaluate her gallbladder.  She was advised to contact her office in 2 weeks time if still symptomatic.

## 2021-06-07 NOTE — Assessment & Plan Note (Signed)
She has started lifestyle interventions inclusive of limited eating out, healthier eating at home, plan to recheck lipids in 6 months time prior to her follow-up.

## 2021-06-07 NOTE — Assessment & Plan Note (Signed)
Is a chronic issue that has stabilized and she denies any significant symptoms since the delivery of her son.  We can track this issue on an as-needed basis.

## 2021-06-07 NOTE — Patient Instructions (Addendum)
-   Start over-the-counter Tums for symptoms noted while eating - Talk to your pediatrician about breast-feeding while on esomeprazole (Nexium) and/or ondansetron (Zofran) - If okay, can dose Nexium for stomach pain and Zofran for nausea - Use heat/gentle massage for right lower leg pain - If persistent, start home exercises with information provided - Contact our office for any lingering symptoms after 1-2 weeks

## 2021-08-30 ENCOUNTER — Ambulatory Visit (INDEPENDENT_AMBULATORY_CARE_PROVIDER_SITE_OTHER): Payer: 59 | Admitting: Family Medicine

## 2021-08-30 ENCOUNTER — Encounter: Payer: Self-pay | Admitting: Family Medicine

## 2021-08-30 ENCOUNTER — Ambulatory Visit: Payer: Self-pay

## 2021-08-30 ENCOUNTER — Other Ambulatory Visit: Payer: Self-pay

## 2021-08-30 VITALS — BP 102/74 | HR 88 | Temp 98.6°F | Ht 65.0 in | Wt 265.0 lb

## 2021-08-30 DIAGNOSIS — K644 Residual hemorrhoidal skin tags: Secondary | ICD-10-CM | POA: Insufficient documentation

## 2021-08-30 DIAGNOSIS — K921 Melena: Secondary | ICD-10-CM

## 2021-08-30 DIAGNOSIS — R1033 Periumbilical pain: Secondary | ICD-10-CM | POA: Diagnosis not present

## 2021-08-30 DIAGNOSIS — R1013 Epigastric pain: Secondary | ICD-10-CM | POA: Insufficient documentation

## 2021-08-30 LAB — POC HEMOCCULT BLD/STL (OFFICE/1-CARD/DIAGNOSTIC)
Card #2 Fecal Occult Blod, POC: NEGATIVE
Fecal Occult Blood, POC: NEGATIVE

## 2021-08-30 MED ORDER — ESOMEPRAZOLE MAGNESIUM 40 MG PO CPDR: 40.0000 mg | DELAYED_RELEASE_CAPSULE | Freq: Every day | ORAL | 0 refills | Status: DC

## 2021-08-30 MED ORDER — HYDROCORTISONE (PERIANAL) 2.5 % EX CREA: 1.0000 "application " | TOPICAL_CREAM | Freq: Two times a day (BID) | CUTANEOUS | 0 refills | Status: DC

## 2021-08-30 NOTE — Telephone Encounter (Signed)
Noted  KP 

## 2021-08-30 NOTE — Assessment & Plan Note (Signed)
Patient with longstanding history of described external hemorrhoids with sporadic episodes of scant bright red blood noted upon wiping.  That being said, she has noted over the past few weeks abdominal pain, urgency defecate after meals, increased pain at the rectum, and bright red blood in the toilet bowl, additionally mixed in stool.  She was previously prescribed esomeprazole but unfortunately did not dose this medication.  She has been watching her diet and denies any change over this timeframe of symptomatology.  Physical exam today reveals negative Hemoccult, prominent hemorrhoids externally without evidence of thrombosis.  Her cardiopulmonary findings are benign, abdominal exam reveals minor tenderness at the epigastric region, hypoactive bowel sounds, otherwise no hepatosplenomegaly noted, no rebound noted, no ecchymosis at the flanks or otherwise.  I have advised topical steroid for the hemorrhoids, start of esomeprazole, and close follow-up with GI with urgent referral placed today.

## 2021-08-30 NOTE — Assessment & Plan Note (Signed)
See additional assessment(s) for plan details. 

## 2021-08-30 NOTE — Progress Notes (Signed)
Primary Care / Sports Medicine Office Visit  Patient Information:  Patient ID: Caroline Mendoza, female DOB: 19-Dec-1990 Age: 30 y.o. MRN: [id number]   Caroline Mendoza is a pleasant 30 y.o. female presenting with the following:  Chief Complaint  Patient presents with   Hematochezia    Abdominal epigastric pain x1 week ago; blood in stool twice since then; bright red; 8/10 pain    Review of Systems pertinent details above   Patient Active Problem List   Diagnosis Date Noted   Hematochezia 08/30/2021   Epigastric pain 08/30/2021   External hemorrhoid, bleeding 08/30/2021   Periumbilical abdominal pain 06/07/2021   Posterior tibial tendinitis, right 06/07/2021   Obesity affecting pregnancy in third trimester 05/08/2021   Hyperlipidemia 05/06/2021   Pelvic pain in female 04/08/2021   Pregnancy with uncertain fetal viability 04/08/2021   Uterine size-date discrepancy in third trimester 04/08/2021   Elevated random blood glucose level 04/08/2021   BMI 45.0-49.9, adult (HCC) 04/08/2021   Patellofemoral arthralgia of right knee 04/08/2021   Allergic rhinitis 04/08/2021   Indication for care in labor or delivery 04/07/2021   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 No pertinent past medical history. Outpatient Encounter Medications as of 08/30/2021  Medication Sig   hydrocortisone (ANUSOL-HC) 2.5 % rectal cream Place 1 application rectally 2 (two) times daily.   Prenatal Vit-Fe Fumarate-FA (PRENATAL VITAMINS PO) Take 2 each by mouth daily.   esomeprazole (NEXIUM) 40 MG capsule Take 1 capsule (40 mg total) by mouth daily.   [DISCONTINUED] esomeprazole (NEXIUM) 40 MG capsule Take 1 capsule (40 mg total) by mouth daily. (Patient not taking: Reported on 08/30/2021)   No facility-administered  encounter medications on file as of 08/30/2021.   Past Surgical History:  Procedure Laterality Date   WISDOM TOOTH EXTRACTION      Vitals:   08/30/21 1510  BP: 102/74  Pulse: 88  Temp: 98.6 F (37 C)  SpO2: 98%   Vitals:   08/30/21 1510  Weight: 265 lb (120.2 kg)  Height: 5\' 5"  (1.651 m)   Body mass index is 44.1 kg/m.  No results found.   Independent interpretation of notes and tests performed by another provider:   None  Procedures performed:   None  Pertinent History, Exam, Impression, and Recommendations:   External hemorrhoid, bleeding Patient with longstanding history of described external hemorrhoids with sporadic episodes of scant bright red blood noted upon wiping.  That being said, she has noted over the past few weeks abdominal pain, urgency defecate after meals, increased pain at the rectum, and bright red blood in the toilet bowl, additionally mixed in stool.  She was previously prescribed esomeprazole but unfortunately did not dose this medication.  She has been watching her diet and denies any change over this timeframe of symptomatology.  Physical exam today reveals negative Hemoccult, prominent hemorrhoids externally without evidence of thrombosis.  Her cardiopulmonary findings are benign, abdominal exam reveals minor tenderness at the epigastric region, hypoactive bowel sounds, otherwise no hepatosplenomegaly noted, no rebound noted, no ecchymosis at the flanks or otherwise.  I have advised topical steroid for the hemorrhoids, start of esomeprazole, and close follow-up with GI with urgent referral placed today.    Epigastric pain See additional assessment(s) for plan details.  Hematochezia See additional assessment(s) for plan details.  Periumbilical abdominal pain See additional assessment(s)  for plan details.   Orders & Medications Meds ordered this encounter  Medications   esomeprazole (NEXIUM) 40 MG capsule    Sig: Take 1 capsule (40 mg  total) by mouth daily.    Dispense:  30 capsule    Refill:  0   hydrocortisone (ANUSOL-HC) 2.5 % rectal cream    Sig: Place 1 application rectally 2 (two) times daily.    Dispense:  30 g    Refill:  0   Orders Placed This Encounter  Procedures   Ambulatory referral to Gastroenterology   POC Hemoccult Bld/Stl (1-Cd Office Dx)     No follow-ups on file.     Jerrol Banana, MD   Primary Care Sports Medicine [facility]

## 2021-08-30 NOTE — Assessment & Plan Note (Addendum)
>>  ASSESSMENT AND PLAN FOR INTERNAL AND EXTERNAL BLEEDING HEMORRHOIDS WRITTEN ON 05/19/2023  4:47 PM BY Aadil Sur, Ocie Bob, MD  >>ASSESSMENT AND PLAN FOR EXTERNAL HEMORRHOID, BLEEDING WRITTEN ON 08/30/2021  4:53 PM BY Oryan Winterton, Ocie Bob, MD  Patient with longstanding history of described external hemorrhoids with sporadic episodes of scant bright red blood noted upon wiping.  That being said, she has noted over the past few weeks abdominal pain, urgency defecate after meals, increased pain at the rectum, and bright red blood in the toilet bowl, additionally mixed in stool.  She was previously prescribed esomeprazole but unfortunately did not dose this medication.  She has been watching her diet and denies any change over this timeframe of symptomatology.  Physical exam today reveals negative Hemoccult, prominent hemorrhoids externally without evidence of thrombosis.  Her cardiopulmonary findings are benign, abdominal exam reveals minor tenderness at the epigastric region, hypoactive bowel sounds, otherwise no hepatosplenomegaly noted, no rebound noted, no ecchymosis at the flanks or otherwise.  I have advised topical steroid for the hemorrhoids, start of esomeprazole, and close follow-up with GI with urgent referral placed today.    >>ASSESSMENT AND PLAN FOR HEMATOCHEZIA WRITTEN ON 08/30/2021  4:54 PM BY Shalona Harbour, Ocie Bob, MD  See additional assessment(s) for plan details.

## 2021-08-30 NOTE — Patient Instructions (Addendum)
-   Dose esomeprazole daily on empty stomach - Apply topica cream twice daily - Referral coordinator will contact you for GI follow-up - Seek emergent medical attention for any worsening symptoms, lightheadedness, excess blood loss, severe abdominal pain, etc

## 2021-08-30 NOTE — Telephone Encounter (Signed)
Pt. Reports she has seen blood in her stool x 2. Started yesterday. Blood was in the water and when she wipes. Mils abdominal cramping. Reports she does have hemorrhoids. Appointment made for today.

## 2021-08-30 NOTE — Telephone Encounter (Signed)
Reason for Disposition  MODERATE rectal bleeding (small blood clots, passing blood without stool, or toilet water turns red)  Answer Assessment - Initial Assessment Questions 1. APPEARANCE of BLOOD: "What color is it?" "Is it passed separately, on the surface of the stool, or mixed in with the stool?"      Bright red 2. AMOUNT: "How much blood was passed?"      Water in toilet 3. FREQUENCY: "How many times has blood been passed with the stools?"      2 x 4. ONSET: "When was the blood first seen in the stools?" (Days or weeks)      Yesterday 5. DIARRHEA: "Is there also some diarrhea?" If Yes, ask: "How many diarrhea stools in the past 24 hours?"      No 6. CONSTIPATION: "Do you have constipation?" If Yes, ask: "How bad is it?"     Yes 7. RECURRENT SYMPTOMS: "Have you had blood in your stools before?" If Yes, ask: "When was the last time?" and "What happened that time?"      Yes 8. BLOOD THINNERS: "Do you take any blood thinners?" (e.g., Coumadin/warfarin, Pradaxa/dabigatran, aspirin)     No 9. OTHER SYMPTOMS: "Do you have any other symptoms?"  (e.g., abdomen pain, vomiting, dizziness, fever)     No 10. PREGNANCY: "Is there any chance you are pregnant?" "When was your last menstrual period?"       No  Protocols used: Rectal Bleeding-A-AH

## 2021-09-03 ENCOUNTER — Encounter: Payer: Self-pay | Admitting: Family Medicine

## 2021-09-03 ENCOUNTER — Other Ambulatory Visit: Payer: Self-pay

## 2021-09-03 DIAGNOSIS — R1013 Epigastric pain: Secondary | ICD-10-CM

## 2021-09-03 DIAGNOSIS — K921 Melena: Secondary | ICD-10-CM

## 2021-09-03 DIAGNOSIS — K644 Residual hemorrhoidal skin tags: Secondary | ICD-10-CM

## 2021-09-06 ENCOUNTER — Telehealth: Payer: Self-pay

## 2021-09-06 ENCOUNTER — Other Ambulatory Visit: Payer: Self-pay

## 2021-09-06 ENCOUNTER — Encounter: Payer: Self-pay | Admitting: *Deleted

## 2021-09-06 DIAGNOSIS — K921 Melena: Secondary | ICD-10-CM

## 2021-09-06 DIAGNOSIS — R1013 Epigastric pain: Secondary | ICD-10-CM

## 2021-09-06 DIAGNOSIS — K644 Residual hemorrhoidal skin tags: Secondary | ICD-10-CM

## 2021-09-06 MED ORDER — NA SULFATE-K SULFATE-MG SULF 17.5-3.13-1.6 GM/177ML PO SOLN: 1.0000 | Freq: Once | ORAL | 0 refills | Status: AC

## 2021-09-06 NOTE — Progress Notes (Signed)
Gastroenterology Pre-Procedure Review  Request Date: 09/17/2021 Requesting Physician: Dr. Maximino Greenland  PATIENT REVIEW QUESTIONS: The patient responded to the following health history questions as indicated:    1. Are you having any GI issues? yes (PAIN AFTER EATING) 2. Do you have a personal history of Polyps? no 3. Do you have a family history of Colon Cancer or Polyps? no 4. Diabetes Mellitus? no 5. Joint replacements in the past 12 months?no 6. Major health problems in the past 3 months?no 7. Any artificial heart valves, MVP, or defibrillator?no    MEDICATIONS & ALLERGIES:    Patient reports the following regarding taking any anticoagulation/antiplatelet therapy:   Plavix, Coumadin, Eliquis, Xarelto, Lovenox, Pradaxa, Brilinta, or Effient? no Aspirin? no  Patient confirms/reports the following medications:  Current Outpatient Medications  Medication Sig Dispense Refill   esomeprazole (NEXIUM) 40 MG capsule Take 1 capsule (40 mg total) by mouth daily. 30 capsule 0   Prenatal Vit-Fe Fumarate-FA (PRENATAL VITAMINS PO) Take 2 each by mouth daily.     No current facility-administered medications for this visit.    Patient confirms/reports the following allergies:  No Known Allergies  No orders of the defined types were placed in this encounter.   AUTHORIZATION INFORMATION Primary Insurance: 1D#: Group #:  Secondary Insurance: 1D#: Group #:  SCHEDULE INFORMATION: Date: 09/17/2021 Time: Location: [facility]

## 2021-09-06 NOTE — Progress Notes (Signed)
PATIENT NOT PREGNANT HAS 68 MONTH OLD

## 2021-09-06 NOTE — Telephone Encounter (Signed)
Pt. Ready to schedule colonoscopy 

## 2021-09-16 ENCOUNTER — Encounter: Payer: Self-pay | Admitting: Gastroenterology

## 2021-09-17 ENCOUNTER — Ambulatory Visit
Admission: RE | Admit: 2021-09-17 | Discharge: 2021-09-17 | Disposition: A | Discharge status: Home / Self Care | Payer: 59 | Source: Ambulatory Visit | Attending: Gastroenterology | Admitting: Gastroenterology

## 2021-09-17 ENCOUNTER — Ambulatory Visit: Payer: 59 | Admitting: Anesthesiology

## 2021-09-17 ENCOUNTER — Encounter
Admission: RE | Disposition: A | Discharge status: Home / Self Care | Payer: Self-pay | Source: Ambulatory Visit | Attending: Gastroenterology

## 2021-09-17 ENCOUNTER — Encounter: Payer: Self-pay | Admitting: Gastroenterology

## 2021-09-17 DIAGNOSIS — K635 Polyp of colon: Secondary | ICD-10-CM | POA: Diagnosis not present

## 2021-09-17 DIAGNOSIS — Z6841 Body Mass Index (BMI) 40.0 and over, adult: Secondary | ICD-10-CM | POA: Insufficient documentation

## 2021-09-17 DIAGNOSIS — K921 Melena: Secondary | ICD-10-CM | POA: Insufficient documentation

## 2021-09-17 DIAGNOSIS — K644 Residual hemorrhoidal skin tags: Secondary | ICD-10-CM | POA: Diagnosis not present

## 2021-09-17 DIAGNOSIS — R1013 Epigastric pain: Secondary | ICD-10-CM | POA: Insufficient documentation

## 2021-09-17 DIAGNOSIS — K648 Other hemorrhoids: Secondary | ICD-10-CM | POA: Diagnosis not present

## 2021-09-17 HISTORY — PX: COLONOSCOPY WITH PROPOFOL: SHX5780

## 2021-09-17 LAB — POCT PREGNANCY, URINE: Preg Test, Ur: NEGATIVE

## 2021-09-17 SURGERY — COLONOSCOPY WITH PROPOFOL
Anesthesia: General

## 2021-09-17 MED ORDER — LIDOCAINE HCL (PF) 1 % IJ SOLN
INTRAMUSCULAR | Status: AC
  Filled 2021-09-17: qty 2

## 2021-09-17 MED ORDER — PROPOFOL 500 MG/50ML IV EMUL
INTRAVENOUS | Status: AC
  Filled 2021-09-17: qty 50

## 2021-09-17 MED ORDER — PROPOFOL 10 MG/ML IV BOLUS
INTRAVENOUS | Status: DC | PRN
  Administered 2021-09-17: 70 mg via INTRAVENOUS

## 2021-09-17 MED ORDER — PROPOFOL 10 MG/ML IV BOLUS
INTRAVENOUS | Status: AC
  Filled 2021-09-17: qty 20

## 2021-09-17 MED ORDER — LIDOCAINE HCL (CARDIAC) PF 100 MG/5ML IV SOSY
PREFILLED_SYRINGE | INTRAVENOUS | Status: DC | PRN
  Administered 2021-09-17: 60 mg via INTRAVENOUS

## 2021-09-17 MED ORDER — EPHEDRINE 5 MG/ML INJ
INTRAVENOUS | Status: AC
  Filled 2021-09-17: qty 5

## 2021-09-17 MED ORDER — PROPOFOL 500 MG/50ML IV EMUL
INTRAVENOUS | Status: DC | PRN
  Administered 2021-09-17: 140 ug/kg/min via INTRAVENOUS

## 2021-09-17 MED ORDER — PROPOFOL 500 MG/50ML IV EMUL
INTRAVENOUS | Status: AC
  Filled 2021-09-17: qty 100

## 2021-09-17 MED ORDER — DEXMEDETOMIDINE HCL IN NACL 200 MCG/50ML IV SOLN
INTRAVENOUS | Status: DC | PRN
  Administered 2021-09-17: 12 ug via INTRAVENOUS
  Administered 2021-09-17: 8 ug via INTRAVENOUS

## 2021-09-17 MED ORDER — SODIUM CHLORIDE 0.9 % IV SOLN: INTRAVENOUS | Status: DC

## 2021-09-17 MED ORDER — LIDOCAINE HCL (PF) 2 % IJ SOLN
INTRAMUSCULAR | Status: AC
  Filled 2021-09-17: qty 15

## 2021-09-17 NOTE — Op Note (Signed)
[facility] Gastroenterology Patient Name: Caroline Mendoza Procedure Date: 09/17/2021 8:49 AM MRN: [id number] Account #: [id number] Date of Birth: 1991/07/17 Admit Type: Outpatient Age: 30 Room: [facility] ENDO ROOM 2 Gender: Female Note Status: Finalized Instrument Name: Prentice Docker [id number] Procedure:             Colonoscopy Indications:           Hematochezia Providers:             Eliezer Khawaja B. Maximino Greenland MD, MD Referring MD:          Sallye Lat Md, MD (Referring MD) Medicines:             Monitored Anesthesia Care Complications:         No immediate complications. Procedure:             Pre-Anesthesia Assessment:                        - ASA Grade Assessment: II - A patient with mild                         systemic disease.                        - Prior to the procedure, a History and Physical was                         performed, and patient medications, allergies and                         sensitivities were reviewed. The patient's tolerance                         of previous anesthesia was reviewed.                        - The risks and benefits of the procedure and the                         sedation options and risks were discussed with the                         patient. All questions were answered and informed                         consent was obtained.                        - Patient identification and proposed procedure were                         verified prior to the procedure by the physician, the                         nurse, the anesthesiologist, the anesthetist and the                         technician. The procedure was verified in the                         procedure  room.                        After obtaining informed consent, the colonoscope was                         passed under direct vision. Throughout the procedure,                         the patient's blood pressure, pulse, and oxygen                         saturations  were monitored continuously. The                         Colonoscope was introduced through the anus and                         advanced to the the cecum, identified by appendiceal                         orifice and ileocecal valve. The colonoscopy was                         performed with ease. The patient tolerated the                         procedure well. The quality of the bowel preparation                         was good. Findings:      Hemorrhoids were found on perianal exam.      Two sessile polyps were found in the sigmoid colon. The polyps were 4 to       5 mm in size. These polyps were removed with a cold snare. Resection and       retrieval were complete.      The exam was otherwise without abnormality.      The rectum, sigmoid colon, descending colon, transverse colon, ascending       colon and cecum appeared normal.      Non-bleeding internal hemorrhoids were found during retroflexion. The       hemorrhoids were large. Impression:            - Hemorrhoids found on perianal exam.                        - Two 4 to 5 mm polyps in the sigmoid colon, removed                         with a cold snare. Resected and retrieved.                        - The examination was otherwise normal.                        - The rectum, sigmoid colon, descending colon,                         transverse colon, ascending colon and cecum are normal.                        -  Non-bleeding internal hemorrhoids. Recommendation:        - Discharge patient to home (with escort).                        - High fiber diet.                        - Advance diet as tolerated.                        - Return to GI clinic in 2-4 weeks for evaluation of                         epigastric pain and discuss hemorrhoid banding.                        - Continue present medications.                        - Await pathology results.                        - Repeat colonoscopy date to be determined after                          pending pathology results are reviewed.                        - The findings and recommendations were discussed with                         the patient.                        - The findings and recommendations were discussed with                         the patient's family.                        - Return to primary care physician as previously                         scheduled. Procedure Code(s):     --- Professional ---                        [phone], Colonoscopy, flexible; with removal of                         tumor(s), polyp(s), or other lesion(s) by snare                         technique Diagnosis Code(s):     --- Professional ---                        K63.5, Polyp of colon                        K92.1, Melena (includes Hematochezia) CPT copyright 2019 American Medical Association. All rights reserved. The codes documented in this report are preliminary and upon coder  review may  be revised to meet current compliance requirements.  [name], MD Michel Bickers B. Maximino Greenland MD, MD 09/17/2021 9:22:32 AM This report has been signed electronically. Number of Addenda: 0 Note Initiated On: 09/17/2021 8:49 AM Estimated Blood Loss:  Estimated blood loss: none.      [facility]

## 2021-09-17 NOTE — Anesthesia Postprocedure Evaluation (Signed)
Anesthesia Post Note  Patient: Caroline Mendoza  Procedure(s) Performed: COLONOSCOPY WITH PROPOFOL  Patient location during evaluation: PACU Anesthesia Type: General Level of consciousness: awake and alert, oriented and patient cooperative Pain management: pain level controlled Vital Signs Assessment: post-procedure vital signs reviewed and stable Respiratory status: spontaneous breathing, nonlabored ventilation and respiratory function stable Cardiovascular status: blood pressure returned to baseline and stable Postop Assessment: adequate PO intake Anesthetic complications: no   No notable events documented.   Last Vitals:  Vitals:   09/17/21 0946 09/17/21 0950  BP:  116/85  Pulse: 73 75  Resp: 11 15  Temp:    SpO2: 100% 100%    Last Pain:  Vitals:   09/17/21 0800  TempSrc: Temporal  PainSc: 0-No pain                 Reed Breech

## 2021-09-17 NOTE — Anesthesia Preprocedure Evaluation (Signed)
Anesthesia Evaluation  Patient identified by MRN, date of birth, ID band Patient awake    Reviewed: Allergy & Precautions, NPO status , Patient's Chart, lab work & pertinent test results  History of Anesthesia Complications Negative for: history of anesthetic complications  Airway Mallampati: I   Neck ROM: Full    Dental no notable dental hx.    Pulmonary neg pulmonary ROS,    Pulmonary exam normal breath sounds clear to auscultation       Cardiovascular Exercise Tolerance: Good negative cardio ROS Normal cardiovascular exam Rhythm:Regular Rate:Normal     Neuro/Psych negative neurological ROS     GI/Hepatic negative GI ROS,   Endo/Other  Class 3 obesity  Renal/GU negative Renal ROS     Musculoskeletal   Abdominal   Peds  Hematology negative hematology ROS (+)   Anesthesia Other Findings   Reproductive/Obstetrics                            Anesthesia Physical Anesthesia Plan  ASA: 3  Anesthesia Plan: General   Post-op Pain Management:    Induction: Intravenous  PONV Risk Score and Plan: 3 and Propofol infusion, TIVA and Treatment may vary due to age or medical condition  Airway Management Planned: Natural Airway  Additional Equipment:   Intra-op Plan:   Post-operative Plan:   Informed Consent: I have reviewed the patients History and Physical, chart, labs and discussed the procedure including the risks, benefits and alternatives for the proposed anesthesia with the patient or authorized representative who has indicated his/her understanding and acceptance.       Plan Discussed with: CRNA  Anesthesia Plan Comments: (Patient is not breastfeeding.)        Anesthesia Quick Evaluation

## 2021-09-17 NOTE — Transfer of Care (Signed)
Immediate Anesthesia Transfer of Care Note  Patient: Caroline Mendoza  Procedure(s) Performed: COLONOSCOPY WITH PROPOFOL  Patient Location: PACU  Anesthesia Type:General  Level of Consciousness: awake, alert  and oriented  Airway & Oxygen Therapy: Patient Spontanous Breathing  Post-op Assessment: Report given to RN and Post -op Vital signs reviewed and stable  Post vital signs: Reviewed and stable  Last Vitals:  Vitals Value Taken Time  BP 94/66 09/17/21 0920  Temp    Pulse 88 09/17/21 0921  Resp 12 09/17/21 0921  SpO2 98 % 09/17/21 0921  Vitals shown include unvalidated device data.  Last Pain:  Vitals:   09/17/21 0800  TempSrc: Temporal  PainSc: 0-No pain         Complications: No notable events documented.

## 2021-09-17 NOTE — H&P (Signed)
Caroline Bouillon, MD [address], [address], [place], [place], [address] [address], [address], [place], [place], [address] Phone: [phone]  Fax: [phone]  Primary Care Physician:  Caroline Banana, MD   Pre-Procedure History & Physical: HPI:  Caroline Mendoza is a 30 y.o. female is here for a colonoscopy.   History reviewed. No pertinent past medical history.  Past Surgical History:  Procedure Laterality Date   WISDOM TOOTH EXTRACTION      Prior to Admission medications   Medication Sig Start Date End Date Taking? Authorizing Provider  esomeprazole (NEXIUM) 40 MG capsule Take 1 capsule (40 mg total) by mouth daily. 08/30/21   Caroline Banana, MD  Prenatal Vit-Fe Fumarate-FA (PRENATAL VITAMINS PO) Take 2 each by mouth daily.    [provider]    Allergies as of 09/06/2021   (No Known Allergies)    Family History  Problem Relation Age of Onset   Healthy Mother    Heart attack Father        73   Stroke Father        2020   Healthy Sister    Healthy Brother    Healthy Sister     Social History   Socioeconomic History   Marital status: Married    Spouse name: [name]   Number of children: 1   Years of education: 16   Highest education level: Bachelor's degree (e.g., BA, AB, BS)  Occupational History   Occupation: Call center  Tobacco Use   Smoking status: Never   Smokeless tobacco: Never  Vaping Use   Vaping Use: Never used  Substance and Sexual Activity   Alcohol use: Not Currently   Drug use: Never   Sexual activity: Not Currently    Partners: Male    Birth control/protection: I.U.D.  Other Topics Concern   Not on file  Social History Narrative   Not on file   Social Determinants of Health   Financial Resource Strain: Not on file  Food Insecurity: Not on file  Transportation Needs: Not on file  Physical Activity: Not on file  Stress: Not on file  Social Connections: Not on file  Intimate  Partner Violence: Not on file    Review of Systems: See HPI, otherwise negative ROS  Physical Exam: Constitutional: General:   Alert,  Well-developed, well-nourished, pleasant and cooperative in NAD BP (!) 139/99   Pulse 99   Temp (!) 96.1 F (35.6 C) (Temporal)   Resp 20   Ht 5\' 5"  (1.651 m)   Wt 117.9 kg   LMP 09/17/2021   SpO2 99%   BMI 43.27 kg/m   Head: Normocephalic, atraumatic.   Eyes:  Sclera clear, no icterus.   Conjunctiva pink.   Mouth:  No deformity or lesions, oropharynx pink & moist.  Neck:  Supple, trachea midline  Respiratory: Normal respiratory effort  Gastrointestinal:  Soft, non-tender and non-distended without masses, hepatosplenomegaly or hernias noted.  No guarding or rebound tenderness.     Cardiac: No clubbing or edema.  No cyanosis. Normal posterior tibial pedal pulses noted.  Lymphatic:  No significant cervical adenopathy.  Psych:  Alert and cooperative. Normal mood and affect.  Musculoskeletal:   Symmetrical without gross deformities. 5/5 Lower extremity strength bilaterally.  Skin: Warm. Intact without significant lesions or rashes. No jaundice.  Neurologic:  Face symmetrical, tongue midline, Normal sensation to touch;  grossly normal neurologically.  Psych:  Alert and oriented x3, Alert and cooperative. Normal mood and  affect.  Impression/Plan: Caroline Mendoza is here for a colonoscopy to be performed for hematochezia. This pt was referred by her PCP for hematochezia, epigastric pain. Pt was scheduled by our new scheduling staff without being seen in clinic. I had an extensive discussion with the pt today prior to the procedure and she reports two weeks ago she had an episode with bright red blood in toilet bowl, larger in quantity than before. Has had 1 yr history of intermittent BRBPR but usually on wiping only. Has had constipation which has recently improved.  Her hemoglobin was recently low but she states it was in  relation to recent pregnancy and blood loss during delivery  We discussed that usually she would be seen in clinic and symptoms discussed, labs and stool testing ordered, prior to scheduling her procedures.  We discussed that her epigastric pain may not be evaluated and explained with her colonoscopy and she may need further work-up for this, including labs or EGD.  We discussed that we can either reschedule her procedure and do a clinic visit first, including labs to see if her hemoglobin has improved and scheduled these procedures together in the future if needed.  However, patient would like to proceed with her colonoscopy now and schedule a clinic visit after instead of canceling the colonoscopy today.  We extensively discussed that the colonoscopy results may be normal or benign or may just show hemorrhoids and she verbalized understanding and would like to proceed with her colonoscopy as already scheduled for today.  Risks, benefits, limitations, and alternatives regarding  colonoscopy have been reviewed with the patient.  Questions have been answered.  All parties agreeable.  Clinic staff has been informed of the above, to ensure that patients with symptoms are scheduled in clinic prior to scheduling procedures  Pasty Spillers, MD  09/17/2021, 8:50 AM

## 2021-09-20 ENCOUNTER — Encounter: Payer: Self-pay | Admitting: Gastroenterology

## 2021-09-20 ENCOUNTER — Encounter: Payer: Self-pay | Admitting: Family Medicine

## 2021-09-20 LAB — SURGICAL PATHOLOGY

## 2021-09-20 NOTE — Telephone Encounter (Signed)
Patient scheduled for Thursday at 4:20 PM.  Please advise for medication management of esomeprazole.

## 2021-09-23 ENCOUNTER — Ambulatory Visit: Payer: 59 | Admitting: Family Medicine

## 2021-09-23 ENCOUNTER — Other Ambulatory Visit: Payer: Self-pay

## 2021-09-30 ENCOUNTER — Ambulatory Visit: Payer: 59 | Admitting: Family Medicine

## 2021-10-22 ENCOUNTER — Ambulatory Visit: Admit: 2021-10-22 | Payer: 59 | Source: Home / Self Care

## 2021-10-22 ENCOUNTER — Encounter: Payer: Self-pay | Admitting: Emergency Medicine

## 2021-10-22 ENCOUNTER — Ambulatory Visit
Admission: EM | Admit: 2021-10-22 | Discharge: 2021-10-22 | Disposition: A | Discharge status: Home / Self Care | Payer: 59 | Attending: Physician Assistant | Admitting: Physician Assistant

## 2021-10-22 ENCOUNTER — Other Ambulatory Visit: Payer: Self-pay

## 2021-10-22 DIAGNOSIS — J020 Streptococcal pharyngitis: Secondary | ICD-10-CM

## 2021-10-22 DIAGNOSIS — H9202 Otalgia, left ear: Secondary | ICD-10-CM | POA: Diagnosis present

## 2021-10-22 LAB — RAPID INFLUENZA A&B ANTIGENS
Influenza A (ARMC): NEGATIVE
Influenza B (ARMC): NEGATIVE

## 2021-10-22 MED ORDER — AMOXICILLIN-POT CLAVULANATE 875-125 MG PO TABS: 1.0000 | ORAL_TABLET | Freq: Two times a day (BID) | ORAL | 0 refills | Status: DC

## 2021-10-22 MED ORDER — PREDNISONE 10 MG (21) PO TBPK: ORAL_TABLET | Freq: Every day | ORAL | 0 refills | Status: DC

## 2021-10-22 NOTE — ED Provider Notes (Signed)
[facility]    CSN: [id number] Arrival date & time: 10/22/21  1304      History   Chief Complaint Chief Complaint  Patient presents with   Cough   Generalized Body Aches   Fever    HPI Caroline Mendoza is a 30 y.o. female.   Cough, congestion, nausea, sore throat since yesterday with high fever. Pt has noticed a rash on her chest. Has taken otc meds with no releif. Throat hurts the most.    History reviewed. No pertinent past medical history.  Patient Active Problem List   Diagnosis Date Noted   Polyp of sigmoid colon    Hematochezia 08/30/2021   Epigastric pain 08/30/2021   External hemorrhoid, bleeding 08/30/2021   Periumbilical abdominal pain 06/07/2021   Posterior tibial tendinitis, right 06/07/2021   Obesity affecting pregnancy in third trimester 05/08/2021   Hyperlipidemia 05/06/2021   Pelvic pain in female 04/08/2021   Pregnancy with uncertain fetal viability 04/08/2021   Uterine size-date discrepancy in third trimester 04/08/2021   Elevated random blood glucose level 04/08/2021   BMI 45.0-49.9, adult (HCC) 04/08/2021   Patellofemoral arthralgia of right knee 04/08/2021   Allergic rhinitis 04/08/2021   Indication for care in labor or delivery 04/07/2021   Inappropriate sinus tachycardia 02/05/2021   Heart palpitations 01/08/2021   Supervision of other high risk pregnancies, second trimester 09/25/2020   Unspecified hemorrhoids 03/04/2018    Past Surgical History:  Procedure Laterality Date   COLONOSCOPY WITH PROPOFOL N/A 09/17/2021   Procedure: COLONOSCOPY WITH PROPOFOL;  Surgeon: Pasty Spillers, MD;  Location: [facility];  Service: Endoscopy;  Laterality: N/A;   WISDOM TOOTH EXTRACTION      OB History     Gravida  1   Para  1   Term  1   Preterm      AB      Living  1      SAB      IAB      Ectopic      Multiple  0   Live Births  1            Home Medications    Prior to Admission  medications   Medication Sig Start Date End Date Taking? Authorizing Provider  amoxicillin-clavulanate (AUGMENTIN) 875-125 MG tablet Take 1 tablet by mouth every 12 (twelve) hours. 10/22/21  Yes Coralyn Mark, NP  predniSONE (STERAPRED UNI-PAK 21 TAB) 10 MG (21) TBPK tablet Take by mouth daily. Take 6 tabs by mouth daily  for 2 days, then 5 tabs for 2 days, then 4 tabs for 2 days, then 3 tabs for 2 days, 2 tabs for 2 days, then 1 tab by mouth daily for 2 days 10/22/21  Yes Coralyn Mark, NP  Prenatal Vit-Fe Fumarate-FA (PRENATAL VITAMINS PO) Take 2 each by mouth daily.   Yes [provider]  esomeprazole (NEXIUM) 40 MG capsule Take 1 capsule (40 mg total) by mouth daily. 08/30/21   Jerrol Banana, MD    Family History Family History  Problem Relation Age of Onset   Healthy Mother    Heart attack Father        2000   Stroke Father        2020   Healthy Sister    Healthy Brother    Healthy Sister     Social History Social History   Tobacco Use   Smoking status: Never   Smokeless tobacco: Never  Vaping  Use   Vaping Use: Never used  Substance Use Topics   Alcohol use: Not Currently   Drug use: Never     Allergies   Patient has no known allergies.   Review of Systems Review of Systems  Constitutional:  Positive for appetite change, chills and fever. Negative for fatigue.  HENT:  Positive for congestion, postnasal drip, rhinorrhea and sore throat.   Eyes: Negative.   Respiratory:  Positive for cough.   Cardiovascular: Negative.   Gastrointestinal:  Positive for nausea. Negative for abdominal pain and vomiting.  Genitourinary: Negative.   Neurological: Negative.     Physical Exam Triage Vital Signs ED Triage Vitals  Enc Vitals Group     BP 10/22/21 1330 111/85     Pulse Rate 10/22/21 1330 (!) 117     Resp 10/22/21 1330 14     Temp 10/22/21 1330 99.5 F (37.5 C)     Temp Source 10/22/21 1330 Oral     SpO2 10/22/21 1330 97 %     Weight  10/22/21 1326 260 lb (117.9 kg)     Height 10/22/21 1326 5\' 5"  (1.651 m)     Head Circumference --      Peak Flow --      Pain Score 10/22/21 1326 8     Pain Loc --      Pain Edu? --      Excl. in GC? --    No data found.  Updated Vital Signs BP 111/85 (BP Location: Left Arm)   Pulse (!) 117   Temp 99.5 F (37.5 C) (Oral)   Resp 14   Ht 5\' 5"  (1.651 m)   Wt 260 lb (117.9 kg)   SpO2 97%   Breastfeeding No   BMI 43.27 kg/m   Visual Acuity Right Eye Distance:   Left Eye Distance:   Bilateral Distance:    Right Eye Near:   Left Eye Near:    Bilateral Near:     Physical Exam Constitutional:      Appearance: She is obese. She is ill-appearing.  HENT:     Nose: Congestion present.     Mouth/Throat:     Pharynx: Oropharyngeal exudate and posterior oropharyngeal erythema present.  Eyes:     Pupils: Pupils are equal, round, and reactive to light.  Cardiovascular:     Rate and Rhythm: Tachycardia present.  Abdominal:     General: Abdomen is flat. Bowel sounds are normal.  Skin:    General: Skin is warm.     Comments: Upper chest flat rash small amount   Neurological:     General: No focal deficit present.     Mental Status: She is alert.     UC Treatments / Results  Labs (all labs ordered are listed, but only abnormal results are displayed) Labs Reviewed  RAPID INFLUENZA A&B ANTIGENS    EKG   Radiology No results found.  Procedures Procedures (including critical care time)  Medications Ordered in UC Medications - No data to display  Initial Impression / Assessment and Plan / UC Course  I have reviewed the triage vital signs and the nursing notes.  Pertinent labs & imaging results that were available during my care of the patient were reviewed by me and considered in my medical decision making (see chart for details).    Take tylenol as needed for pain   Use humidifier as needed  Take otc meds for cough and cold  Take full dose of abx  with food   If symptoms become worse go to er Flu is negative    Final Clinical Impressions(s) / UC Diagnoses   Final diagnoses:  Streptococcal sore throat  Ear pain, left     Discharge Instructions      Take tylenol as needed for pain   Use humidifier as needed  Take otc meds for cough and cold  Take full dose of abx with food  If symptoms become worse go to er     ED Prescriptions     Medication Sig Dispense Auth. Provider   predniSONE (STERAPRED UNI-PAK 21 TAB) 10 MG (21) TBPK tablet Take by mouth daily. Take 6 tabs by mouth daily  for 2 days, then 5 tabs for 2 days, then 4 tabs for 2 days, then 3 tabs for 2 days, 2 tabs for 2 days, then 1 tab by mouth daily for 2 days 42 tablet Maple Mirza L, NP   amoxicillin-clavulanate (AUGMENTIN) 875-125 MG tablet Take 1 tablet by mouth every 12 (twelve) hours. 14 tablet Coralyn Mark, NP      PDMP not reviewed this encounter.   Coralyn Mark, NP 10/22/21 1524

## 2021-10-22 NOTE — Discharge Instructions (Signed)
Take tylenol as needed for pain   Use humidifier as needed  Take otc meds for cough and cold  Take full dose of abx with food  If symptoms become worse go to er

## 2021-10-22 NOTE — ED Triage Notes (Signed)
Patient c/o fever, cough, runny nose, sore throat, and bodyaches that started yesterday.

## 2021-11-29 ENCOUNTER — Encounter: Payer: 59 | Admitting: Family Medicine

## 2022-01-06 ENCOUNTER — Telehealth: Payer: Self-pay

## 2022-01-06 NOTE — Telephone Encounter (Signed)
Patient needs appointment for CPE.  Can obtain fasting labs prior to appointment or on the day of appointment if fasting.  Please schedule.

## 2022-02-17 ENCOUNTER — Ambulatory Visit
Admission: EM | Admit: 2022-02-17 | Discharge: 2022-02-17 | Disposition: A | Discharge status: Home / Self Care | Payer: BC Managed Care – PPO | Attending: Physician Assistant | Admitting: Physician Assistant

## 2022-02-17 ENCOUNTER — Other Ambulatory Visit: Payer: Self-pay

## 2022-02-17 ENCOUNTER — Encounter: Payer: Self-pay | Admitting: Emergency Medicine

## 2022-02-17 DIAGNOSIS — R051 Acute cough: Secondary | ICD-10-CM | POA: Insufficient documentation

## 2022-02-17 DIAGNOSIS — Z20822 Contact with and (suspected) exposure to covid-19: Secondary | ICD-10-CM | POA: Diagnosis not present

## 2022-02-17 DIAGNOSIS — J069 Acute upper respiratory infection, unspecified: Secondary | ICD-10-CM

## 2022-02-17 DIAGNOSIS — J029 Acute pharyngitis, unspecified: Secondary | ICD-10-CM

## 2022-02-17 LAB — GROUP A STREP BY PCR: Group A Strep by PCR: NOT DETECTED

## 2022-02-17 MED ORDER — LIDOCAINE VISCOUS HCL 2 % MT SOLN
15.0000 mL | OROMUCOSAL | 0 refills | Status: DC | PRN
Start: 2022-02-17 — End: 2022-09-09

## 2022-02-17 MED ORDER — PROMETHAZINE-DM 6.25-15 MG/5ML PO SYRP: 5.0000 mL | ORAL_SOLUTION | Freq: Four times a day (QID) | ORAL | 0 refills | Status: DC | PRN

## 2022-02-17 NOTE — ED Provider Notes (Signed)
[facility]    CSN: [id number] Arrival date & time: 02/17/22  0846      History   Chief Complaint Chief Complaint  Patient presents with   Sore Throat   Cough    HPI Caroline Mendoza is a 31 y.o. female presenting for 2-day history of severe sore throat, cough, congestion, fatigue, bilateral ear pain.  Denies fever, chest pain, breathing trouble, nausea/vomiting or diarrhea.  Reports that her child is sick with an ear infection.  She has not taken any over-the-counter medicine for symptoms.  No COVID or flu exposure to her knowledge but her child was not tested.  No other complaints.  HPI  History reviewed. No pertinent past medical history.  Patient Active Problem List   Diagnosis Date Noted   Polyp of sigmoid colon    Hematochezia 08/30/2021   Epigastric pain 08/30/2021   External hemorrhoid, bleeding 08/30/2021   Periumbilical abdominal pain 06/07/2021   Posterior tibial tendinitis, right 06/07/2021   Obesity affecting pregnancy in third trimester 05/08/2021   Hyperlipidemia 05/06/2021   Pelvic pain in female 04/08/2021   Pregnancy with uncertain fetal viability 04/08/2021   Uterine size-date discrepancy in third trimester 04/08/2021   Elevated random blood glucose level 04/08/2021   BMI 45.0-49.9, adult (HCC) 04/08/2021   Patellofemoral arthralgia of right knee 04/08/2021   Allergic rhinitis 04/08/2021   Indication for care in labor or delivery 04/07/2021   Inappropriate sinus tachycardia 02/05/2021   Heart palpitations 01/08/2021   Supervision of other high risk pregnancies, second trimester 09/25/2020   Unspecified hemorrhoids 03/04/2018    Past Surgical History:  Procedure Laterality Date   COLONOSCOPY WITH PROPOFOL N/A 09/17/2021   Procedure: COLONOSCOPY WITH PROPOFOL;  Surgeon: Pasty Spillers, MD;  Location: [facility];  Service: Endoscopy;  Laterality: N/A;   WISDOM TOOTH EXTRACTION      OB History     Gravida  1    Para  1   Term  1   Preterm      AB      Living  1      SAB      IAB      Ectopic      Multiple  0   Live Births  1            Home Medications    Prior to Admission medications   Medication Sig Start Date End Date Taking? Authorizing Provider  lidocaine (XYLOCAINE) 2 % solution Use as directed 15 mLs in the mouth or throat every 3 (three) hours as needed for mouth pain (swish and spit). 02/17/22  Yes Shirlee Latch, PA-C  norethindrone-ethinyl estradiol (LOESTRIN) 1-20 MG-MCG tablet Take by mouth. 11/29/21 11/29/22 Yes [provider]  promethazine-dextromethorphan (PROMETHAZINE-DM) 6.25-15 MG/5ML syrup Take 5 mLs by mouth 4 (four) times daily as needed. 02/17/22  Yes Shirlee Latch, PA-C  esomeprazole (NEXIUM) 40 MG capsule Take 1 capsule (40 mg total) by mouth daily. 08/30/21   Jerrol Banana, MD  Prenatal Vit-Fe Fumarate-FA (PRENATAL VITAMINS PO) Take 2 each by mouth daily.    [provider]    Family History Family History  Problem Relation Age of Onset   Healthy Mother    Heart attack Father        44   Stroke Father        2020   Healthy Sister    Healthy Brother    Healthy Sister     Social History  Social History   Tobacco Use   Smoking status: Never   Smokeless tobacco: Never  Vaping Use   Vaping Use: Never used  Substance Use Topics   Alcohol use: Not Currently   Drug use: Never     Allergies   Patient has no known allergies.   Review of Systems Review of Systems  Constitutional:  Positive for fatigue. Negative for chills, diaphoresis and fever.  HENT:  Positive for congestion, rhinorrhea and sore throat. Negative for ear pain, sinus pressure and sinus pain.   Respiratory:  Positive for cough. Negative for shortness of breath.   Gastrointestinal:  Negative for abdominal pain, nausea and vomiting.  Musculoskeletal:  Negative for arthralgias and myalgias.  Skin:  Negative for rash.  Neurological:  Negative for  weakness and headaches.  Hematological:  Negative for adenopathy.    Physical Exam Triage Vital Signs ED Triage Vitals  Enc Vitals Group     BP      Pulse      Resp      Temp      Temp src      SpO2      Weight      Height      Head Circumference      Peak Flow      Pain Score      Pain Loc      Pain Edu?      Excl. in GC?    No data found.  Updated Vital Signs BP 121/82 (BP Location: Left Arm)    Pulse 91    Temp 98.4 F (36.9 C) (Oral)    Resp 18    Ht 5\' 5"  (1.651 m)    Wt 259 lb 14.8 oz (117.9 kg)    LMP 02/10/2022 (Approximate)    SpO2 97%    Breastfeeding No    BMI 43.25 kg/m      Physical Exam Vitals and nursing note reviewed.  Constitutional:      General: She is not in acute distress.    Appearance: Normal appearance. She is well-developed. She is not ill-appearing or toxic-appearing.  HENT:     Head: Normocephalic and atraumatic.     Right Ear: Tympanic membrane, ear canal and external ear normal.     Left Ear: Tympanic membrane, ear canal and external ear normal.     Nose: Congestion present.     Mouth/Throat:     Mouth: Mucous membranes are moist.     Pharynx: Oropharynx is clear. Posterior oropharyngeal erythema present.  Eyes:     General: No scleral icterus.       Right eye: No discharge.        Left eye: No discharge.     Conjunctiva/sclera: Conjunctivae normal.  Cardiovascular:     Rate and Rhythm: Normal rate and regular rhythm.     Heart sounds: Normal heart sounds.  Pulmonary:     Effort: Pulmonary effort is normal. No respiratory distress.     Breath sounds: Normal breath sounds.  Musculoskeletal:     Cervical back: Neck supple.  Skin:    General: Skin is dry.  Neurological:     General: No focal deficit present.     Mental Status: She is alert. Mental status is at baseline.     Motor: No weakness.     Gait: Gait normal.  Psychiatric:        Mood and Affect: Mood normal.        Behavior:  Behavior normal.        Thought Content:  Thought content normal.     UC Treatments / Results  Labs (all labs ordered are listed, but only abnormal results are displayed) Labs Reviewed  GROUP A STREP BY PCR  SARS CORONAVIRUS 2 (TAT 6-24 HRS)    EKG   Radiology No results found.  Procedures Procedures (including critical care time)  Medications Ordered in UC Medications - No data to display  Initial Impression / Assessment and Plan / UC Course  I have reviewed the triage vital signs and the nursing notes.  Pertinent labs & imaging results that were available during my care of the patient were reviewed by me and considered in my medical decision making (see chart for details).  31 year old female presenting for onset of severe sore throat, cough, congestion yesterday.  No fever.  Vitals are normal and stable.  Patient overall well-appearing.  Nasal congestion on exam as well as mild posterior pharyngeal erythema.  Chest clear to auscultation.  PCR strep test is negative.  PCR COVID test obtained.  Current CDC guidelines, isolation protocol and ED precautions reviewed with patient.  Advised patient symptoms consistent with viral illness.  Supportive care encouraged with increasing rest and fluids.  Sent Promethazine DM to pharmacy as well as viscous lidocaine and advised over-the-counter Chloraseptic spray and cough drops, ibuprofen and Tylenol.  Reviewed return and ER precautions and work-up given.   Final Clinical Impressions(s) / UC Diagnoses   Final diagnoses:  Viral upper respiratory tract infection  Sore throat  Acute cough     Discharge Instructions      -Your strep test was negative. - We have obtained a swab for COVID testing.  That result be back tomorrow.  We will call with positive.  If positive need to isolate for 5 days from when your symptoms started and then wear a mask for 5 days.  If it is negative, this is likely another viral illness which will run its course over the next few days to 1  week in most cases. - Increase rest and fluids.  I sent a cough medicine and viscous lidocaine for your throat.  You can also take ibuprofen and/or Tylenol for the discomfort as well as Chloraseptic spray and cough drops.     ED Prescriptions     Medication Sig Dispense Auth. Provider   promethazine-dextromethorphan (PROMETHAZINE-DM) 6.25-15 MG/5ML syrup Take 5 mLs by mouth 4 (four) times daily as needed. 118 mL Eusebio Friendly B, PA-C   lidocaine (XYLOCAINE) 2 % solution Use as directed 15 mLs in the mouth or throat every 3 (three) hours as needed for mouth pain (swish and spit). 100 mL Shirlee Latch, PA-C      PDMP not reviewed this encounter.   Shirlee Latch, PA-C 02/17/22 [phone]

## 2022-02-17 NOTE — ED Triage Notes (Signed)
Pt c/o sore throat, cough, right ear pain. Started yesterday. Denies fever.  ?

## 2022-02-17 NOTE — Discharge Instructions (Addendum)
-  Your strep test was negative. ?- We have obtained a swab for COVID testing.  That result be back tomorrow.  We will call with positive.  If positive need to isolate for 5 days from when your symptoms started and then wear a mask for 5 days.  If it is negative, this is likely another viral illness which will run its course over the next few days to 1 week in most cases. ?- Increase rest and fluids.  I sent a cough medicine and viscous lidocaine for your throat.  You can also take ibuprofen and/or Tylenol for the discomfort as well as Chloraseptic spray and cough drops. ?

## 2022-02-18 LAB — SARS CORONAVIRUS 2 (TAT 6-24 HRS): SARS Coronavirus 2: NEGATIVE

## 2022-07-20 IMAGING — DX DG CHEST 1V PORT
1 series · 1 of 1 positions shown · non-contrast
Comparison: None.

CLINICAL DATA: Upper respiratory infection, sore throat, body
aches, ear pain, third trimester pregnancy

EXAM:
PORTABLE CHEST 1 VIEW

[chest ap]
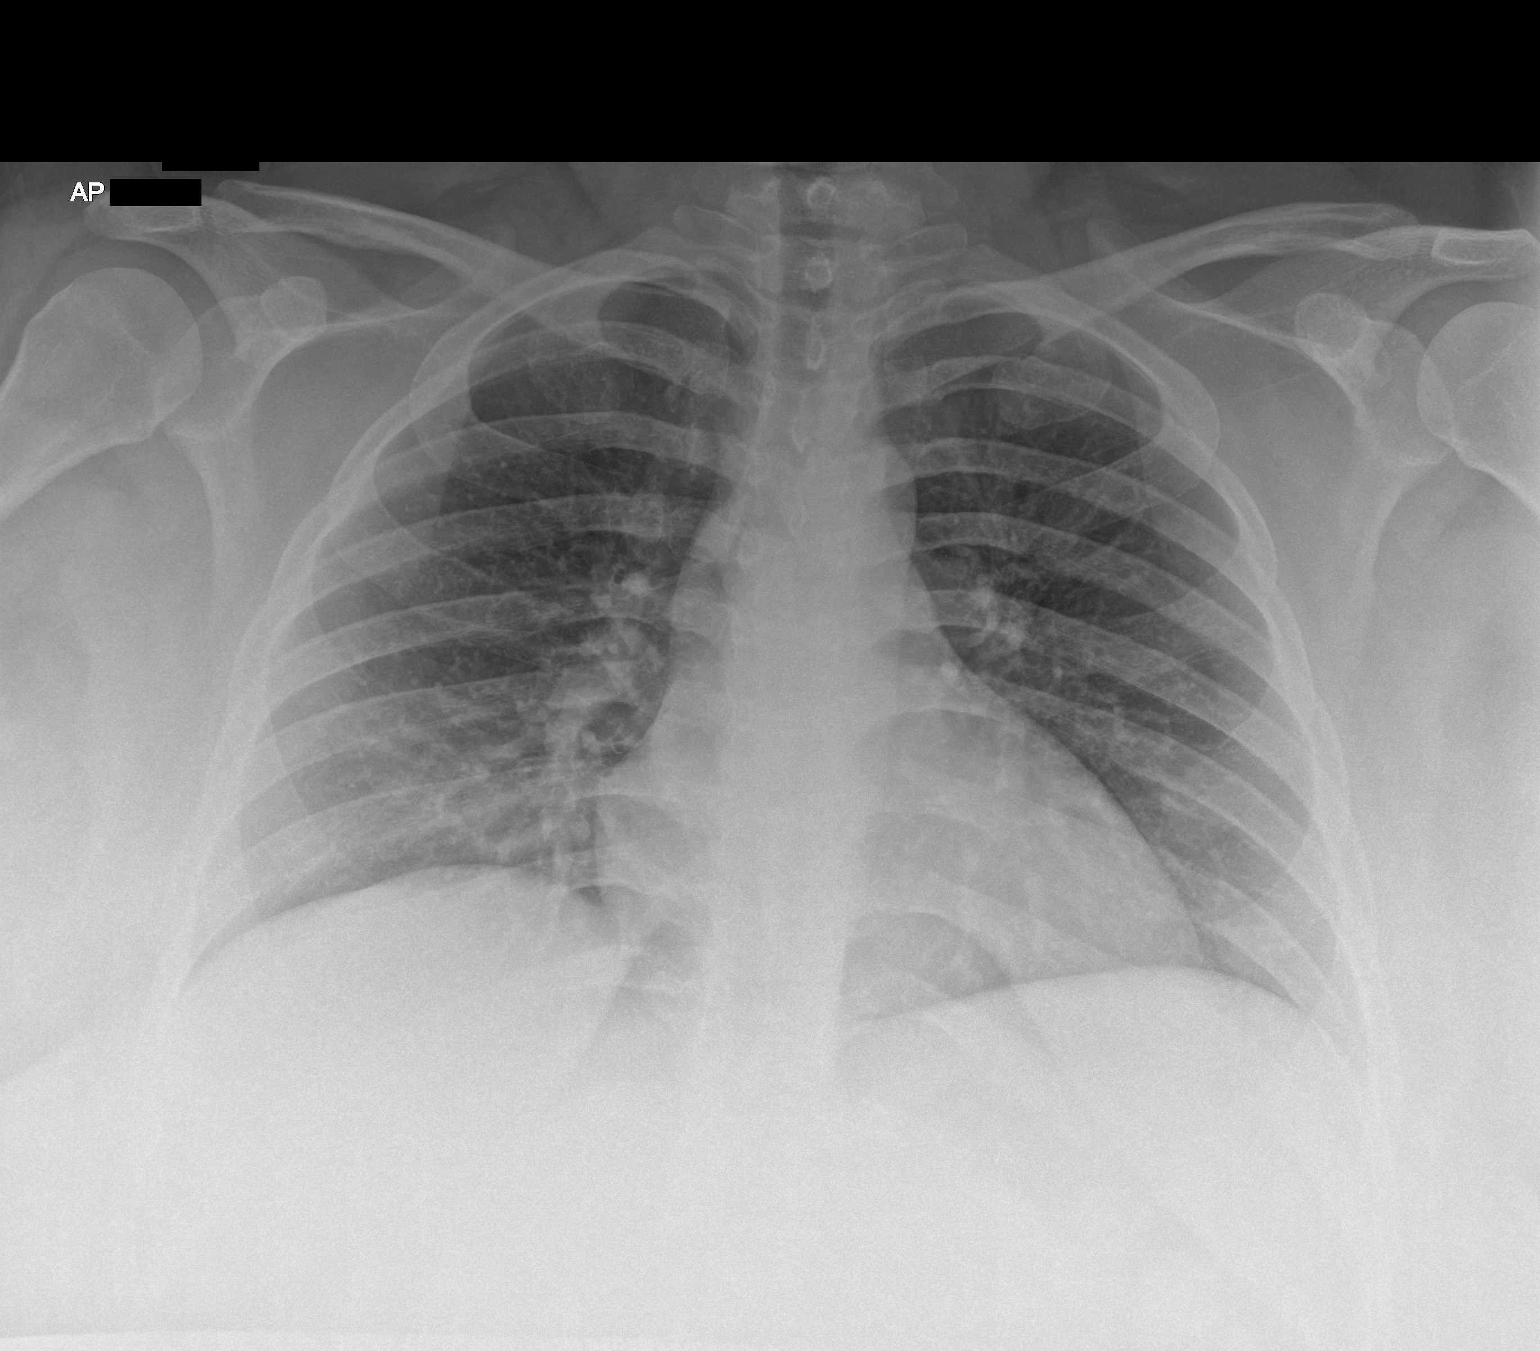

[1 of 1 positions shown; findings below may reference images not displayed]

FINDINGS: Normal heart size. Normal mediastinal contour. No pneumothorax. No
pleural effusion. Lungs appear clear, with no acute consolidative
airspace disease and no pulmonary edema.
IMPRESSION: No active disease.

## 2022-09-09 ENCOUNTER — Ambulatory Visit (INDEPENDENT_AMBULATORY_CARE_PROVIDER_SITE_OTHER): Payer: BC Managed Care – PPO | Admitting: Family Medicine

## 2022-09-09 ENCOUNTER — Encounter: Payer: Self-pay | Admitting: Family Medicine

## 2022-09-09 VITALS — BP 122/84 | HR 78 | Ht 65.0 in | Wt 257.0 lb

## 2022-09-09 DIAGNOSIS — Z1159 Encounter for screening for other viral diseases: Secondary | ICD-10-CM | POA: Diagnosis not present

## 2022-09-09 DIAGNOSIS — Z13220 Encounter for screening for lipoid disorders: Secondary | ICD-10-CM

## 2022-09-09 DIAGNOSIS — Z0000 Encounter for general adult medical examination without abnormal findings: Secondary | ICD-10-CM | POA: Diagnosis not present

## 2022-09-09 DIAGNOSIS — R062 Wheezing: Secondary | ICD-10-CM

## 2022-09-09 DIAGNOSIS — R7989 Other specified abnormal findings of blood chemistry: Secondary | ICD-10-CM | POA: Diagnosis not present

## 2022-09-09 NOTE — Assessment & Plan Note (Signed)
Annual examination completed, risk stratification labs ordered, anticipatory guidance provided.  We will follow labs once resulted. 

## 2022-09-09 NOTE — Progress Notes (Signed)
Annual Physical Exam Visit  Patient Information:  Patient ID: Caroline Mendoza, female DOB: 21-Feb-1991 Age: 31 y.o. MRN: [id number]   Subjective:   CC: Annual Physical Exam  HPI:  Caroline Mendoza is here for their annual physical.  I reviewed the past medical history, family history, social history, surgical history, and allergies today and changes were made as necessary.  Please see the problem list section below for additional details.  Past Medical History: Past Medical History:  Diagnosis Date   Hemorrhoids    Past Surgical History: Past Surgical History:  Procedure Laterality Date   COLONOSCOPY WITH PROPOFOL N/A 09/17/2021   Procedure: COLONOSCOPY WITH PROPOFOL;  Surgeon: Virgel Manifold, MD;  Location: [facility];  Service: Endoscopy;  Laterality: N/A;   WISDOM TOOTH EXTRACTION     Family History: Family History  Problem Relation Age of Onset   Healthy Mother    Heart attack Father        2000   Stroke Father        2020   Healthy Sister    Healthy Brother    Healthy Sister    Allergies: No Known Allergies Health Maintenance: Health Maintenance  Topic Date Due   Hepatitis C Screening  Never done   PAP-Cervical Cytology Screening  Never done   PAP SMEAR-Modifier  Never done   COVID-19 Vaccine (1) 09/25/2022 (Originally 09/04/1991)   INFLUENZA VACCINE  03/12/2023 (Originally 07/12/2022)   TETANUS/TDAP  02/27/2031   HIV Screening  Completed   HPV VACCINES  Aged Out    HM Colonoscopy     This patient has no relevant Health Maintenance data.      Medications: Current Outpatient Medications on File Prior to Visit  Medication Sig Dispense Refill   norethindrone-ethinyl estradiol (LOESTRIN) 1-20 MG-MCG tablet Take by mouth.     No current facility-administered medications on file prior to visit.    Review of Systems: No headache, visual changes, nausea, vomiting, diarrhea, constipation, dizziness, abdominal pain,  skin rash, fevers, chills, night sweats, swollen lymph nodes, weight loss, chest pain, body aches, joint swelling, muscle aches, shortness of breath, mood changes, visual or auditory hallucinations reported.  Objective:   Vitals:   09/09/22 0802  BP: 122/84  Pulse: 78  SpO2: 99%   Vitals:   09/09/22 0802  Weight: 257 lb (116.6 kg)  Height: 5\' 5"  (1.651 m)   Body mass index is 42.77 kg/m.  General: Well Developed, well nourished, and in no acute distress.  Neuro: Alert and oriented x3, extra-ocular muscles intact, sensation grossly intact. Cranial nerves II through XII are grossly intact, motor, sensory, and coordinative functions are intact. HEENT: Normocephalic, atraumatic, pupils equal round reactive to light, neck supple, no masses, no lymphadenopathy, thyroid nonpalpable. Oropharynx, nasopharynx, external ear canals are unremarkable. Skin: Warm and dry, no rashes noted.  Cardiac: Regular rate and rhythm, no murmurs rubs or gallops. No peripheral edema. Pulses symmetric. Respiratory: Faint wheezes on auscultation bilaterally. Not using accessory muscles, speaking in full sentences.  Abdominal: Soft, nontender, nondistended, positive bowel sounds, no masses, no organomegaly. Musculoskeletal: Shoulder, elbow, wrist, hip, knee, ankle stable, and with full range of motion.  Female chaperone initials: KG present throughout the physical examination.  Impression and Recommendations:   The patient was counselled, risk factors were discussed, and anticipatory guidance given.  Problem List Items Addressed This Visit       Other   Severe obesity (BMI >= 40) ([place])  Patient has made improvement in weight, updated labs ordered, interested in weight management pharmacotherapeutic options, will return in 1 month to discuss further, over interim resources advised to look into.      Annual physical exam - Primary    Annual examination completed, risk stratification labs ordered,  anticipatory guidance provided.  We will follow labs once resulted.      Relevant Orders   CBC   Comprehensive metabolic panel   Hepatitis C antibody   Lipid panel   TSH   VITAMIN D 25 Hydroxy (Vit-D Deficiency, Fractures)   Wheeze    Wheezes noted in the setting of allergic rhinitis, O2 reassuring, advised 7 day course of antihistamine and intranasal steroid, follow-up in 1 month.      Other Visit Diagnoses     Need for hepatitis C screening test       Relevant Orders   Hepatitis C antibody   Screening for lipoid disorders       Relevant Orders   Comprehensive metabolic panel   Lipid panel   Low serum vitamin D       Relevant Orders   VITAMIN D 25 Hydroxy (Vit-D Deficiency, Fractures)        Orders & Medications Medications: No orders of the defined types were placed in this encounter.  Orders Placed This Encounter  Procedures   CBC   Comprehensive metabolic panel   Hepatitis C antibody   Lipid panel   TSH   VITAMIN D 25 Hydroxy (Vit-D Deficiency, Fractures)     Return in about 4 weeks (around 10/07/2022).    Jerrol Banana, MD   Primary Care Sports Medicine [facility]

## 2022-09-09 NOTE — Assessment & Plan Note (Signed)
Wheezes noted in the setting of allergic rhinitis, O2 reassuring, advised 7 day course of antihistamine and intranasal steroid, follow-up in 1 month.

## 2022-09-09 NOTE — Patient Instructions (Signed)
-   Obtain fasting labs with orders provided (can have water or black coffee but otherwise no food or drink x 8 hours before labs) -Start over-the-counter antihistamine (Claritin, Zyrtec, etc.) and Flonase (steroid nasal spray) daily x7 days then as needed - Can look into [email or web address] and/or contact your insurance company about [phone] coverage - Review information provided - Attend eye doctor annually, dentist every 6 months, work towards or maintain 30 minutes of moderate intensity physical activity at least 5 days per week, and consume a balanced diet - Return in 1 month for follow-up - Contact us for any questions between now and then

## 2022-09-09 NOTE — Assessment & Plan Note (Signed)
Patient has made improvement in weight, updated labs ordered, interested in weight management pharmacotherapeutic options, will return in 1 month to discuss further, over interim resources advised to look into.

## 2022-09-10 LAB — CBC
Hematocrit: 40 % (ref 34.0–46.6)
Hemoglobin: 13.3 g/dL (ref 11.1–15.9)
MCH: 27.2 pg (ref 26.6–33.0)
MCHC: 33.3 g/dL (ref 31.5–35.7)
MCV: 82 fL (ref 79–97)
Platelets: 404 10*3/uL (ref 150–450)
RBC: 4.89 x10E6/uL (ref 3.77–5.28)
RDW: 13.1 % (ref 11.7–15.4)
WBC: 8.3 10*3/uL (ref 3.4–10.8)

## 2022-09-10 LAB — TSH: TSH: 3.55 u[IU]/mL (ref 0.450–4.500)

## 2022-09-10 LAB — LIPID PANEL
Chol/HDL Ratio: 5.2 ratio — ABNORMAL HIGH (ref 0.0–4.4)
Cholesterol, Total: 298 mg/dL — ABNORMAL HIGH (ref 100–199)
HDL: 57 mg/dL (ref >39)
LDL Chol Calc (NIH): 195 mg/dL — ABNORMAL HIGH (ref 0–99)
Triglycerides: 238 mg/dL — ABNORMAL HIGH (ref 0–149)
VLDL Cholesterol Cal: 46 mg/dL — ABNORMAL HIGH (ref 5–40)

## 2022-09-10 LAB — COMPREHENSIVE METABOLIC PANEL
ALT: 11 IU/L (ref 0–32)
AST: 9 IU/L (ref 0–40)
Albumin/Globulin Ratio: 1.2 (ref 1.2–2.2)
Albumin: 4.2 g/dL (ref 4.0–5.0)
Alkaline Phosphatase: 82 IU/L (ref 44–121)
BUN/Creatinine Ratio: 17 (ref 9–23)
BUN: 11 mg/dL (ref 6–20)
Bilirubin Total: 0.3 mg/dL (ref 0.0–1.2)
CO2: 23 mmol/L (ref 20–29)
Calcium: 9.4 mg/dL (ref 8.7–10.2)
Chloride: 101 mmol/L (ref 96–106)
Creatinine, Ser: 0.65 mg/dL (ref 0.57–1.00)
Globulin, Total: 3.4 g/dL (ref 1.5–4.5)
Glucose: 90 mg/dL (ref 70–99)
Potassium: 4.5 mmol/L (ref 3.5–5.2)
Sodium: 139 mmol/L (ref 134–144)
Total Protein: 7.6 g/dL (ref 6.0–8.5)
eGFR: 122 mL/min/{1.73_m2} (ref >59)

## 2022-09-10 LAB — VITAMIN D 25 HYDROXY (VIT D DEFICIENCY, FRACTURES): Vit D, 25-Hydroxy: 18.3 ng/mL — ABNORMAL LOW (ref 30.0–100.0)

## 2022-09-10 LAB — HEPATITIS C ANTIBODY: Hep C Virus Ab: NONREACTIVE

## 2022-09-15 ENCOUNTER — Other Ambulatory Visit: Payer: Self-pay | Admitting: Family Medicine

## 2022-09-15 MED ORDER — VITAMIN D (ERGOCALCIFEROL) 1.25 MG (50000 UNIT) PO CAPS: 50000.0000 [IU] | ORAL_CAPSULE | ORAL | 0 refills | Status: DC

## 2022-10-06 ENCOUNTER — Other Ambulatory Visit: Payer: Self-pay | Admitting: Family Medicine

## 2022-10-06 NOTE — Telephone Encounter (Signed)
Requested medications are due for refill today.  no  Requested medications are on the active medications list.  yes  Last refill. 09/15/2022 #8 0 rf  Future visit scheduled.   yes  Notes to clinic.  Refill not delegated.    Requested Prescriptions  Pending Prescriptions Disp Refills   Vitamin D, Ergocalciferol, (DRISDOL) 1.25 MG (50000 UNIT) CAPS capsule [Pharmacy Med Name: VITAMIN D2 1.25MG (50,000 UNIT)] 12 capsule 1    Sig: Take 1 capsule (50,000 Units total) by mouth every 7 (seven) days. Take for 8 total doses(weeks)     Endocrinology:  Vitamins - Vitamin D Supplementation 2 Failed - 10/06/2022  8:33 AM      Failed - Manual Review: Route requests for 50,000 IU strength to the provider      Failed - Vitamin D in normal range and within 360 days    Vit D, 25-Hydroxy  Date Value Ref Range Status  09/09/2022 18.3 (L) 30.0 - 100.0 ng/mL Final    Comment:    Vitamin D deficiency has been defined by the [place] practice guideline as a level of serum 25-OH vitamin D less than 20 ng/mL (1,2). The Endocrine Society went on to further define vitamin D insufficiency as a level between 21 and 29 ng/mL (2). 1. IOM (Institute of Medicine). 2010. Dietary reference    intakes for calcium and D. [place]: The    [employer]. 2. Holick MF, [place], Bischoff-Ferrari HA, et al.    Evaluation, treatment, and prevention of vitamin D    deficiency: an Endocrine Society clinical practice    guideline. JCEM. 2011 Jul; 96(7):1911-30.          Passed - Ca in normal range and within 360 days    Calcium  Date Value Ref Range Status  09/09/2022 9.4 8.7 - 10.2 mg/dL Final         Passed - Valid encounter within last 12 months    Recent Outpatient Visits           3 weeks ago Annual physical exam   [place] [facility] [place], Earley Abide, MD   1 year ago [place] [facility] at [place], Earley Abide, MD   1 year ago Periumbilical abdominal pain   [place] [facility] at [place], Earley Abide, MD   1 year ago Patellofemoral arthralgia of right knee   [place] [facility] at [place], Earley Abide, MD   1 year ago BMI 45.0-49.9, adult [facility])   [facility] at [facility], Earley Abide, MD       Future Appointments             Tomorrow Montel Culver, MD [facility] and Sports Medicine at [facility], [facility]

## 2022-10-07 ENCOUNTER — Ambulatory Visit: Payer: BC Managed Care – PPO | Admitting: Family Medicine

## 2022-10-07 ENCOUNTER — Encounter: Payer: Self-pay | Admitting: Family Medicine

## 2022-10-07 VITALS — BP 122/80 | HR 98 | Ht 65.0 in | Wt 253.0 lb

## 2022-10-07 DIAGNOSIS — Z7689 Persons encountering health services in other specified circumstances: Secondary | ICD-10-CM | POA: Insufficient documentation

## 2022-10-07 DIAGNOSIS — Z713 Dietary counseling and surveillance: Secondary | ICD-10-CM

## 2022-10-07 MED ORDER — WEGOVY 0.25 MG/0.5ML ~~LOC~~ SOAJ: 0.2500 mg | SUBCUTANEOUS | 0 refills | Status: DC

## 2022-10-07 MED ORDER — WEGOVY 0.5 MG/0.5ML ~~LOC~~ SOAJ: 0.5000 mg | SUBCUTANEOUS | 0 refills | Status: DC

## 2022-10-07 NOTE — Progress Notes (Signed)
     Primary Care / Sports Medicine Office Visit  Patient Information:  Patient ID: Jammie Troup, female DOB: 03-05-1991 Age: 31 y.o. MRN: [id number]   Dalal Livengood Gali Spinney is a pleasant 31 y.o. female presenting with the following:  Chief Complaint  Patient presents with   Follow-up    Pt states she is feeling better with wheezing,     Vitals:   10/07/22 1438  BP: 122/80  Pulse: 98  SpO2: 99%   Vitals:   10/07/22 1438  Weight: 253 lb (114.8 kg)  Height: 5\' 5"  (1.651 m)   Body mass index is 42.1 kg/m.  No results found.   Independent interpretation of notes and tests performed by another provider:   None  Procedures performed:   None  Pertinent History, Exam, Impression, and Recommendations:   Problem List Items Addressed This Visit       Other   Encounter for weight management - Primary    Patient presenting with desire for weight loss, has been making lifestyle changes, does demonstrate interval weight loss of 4 pounds.  We discussed pharmacotherapeutic options and she is amenable to [facility].  Need for titration, risk profile reviewed, and close follow-up advised.  Initiate at 0.25 mg x 4 weeks, second month dose of 0.5 mg sent to pharmacy given current shortage.  Plan for 1 month follow-up to evaluate tolerance, weight, and next steps.      Relevant Medications   WEGOVY 0.5 MG/0.5ML SOAJ   WEGOVY 0.25 MG/0.5ML SOAJ     Orders & Medications Meds ordered this encounter  Medications   WEGOVY 0.5 MG/0.5ML SOAJ    Sig: Inject 0.5 mg into the skin once a week. Use this dose for 1 month (4 shots) and then increase to next higher dose.    Dispense:  2 mL    Refill:  0   WEGOVY 0.25 MG/0.5ML SOAJ    Sig: Inject 0.25 mg into the skin once a week. Use this dose for 1 month (4 shots) and then increase to next higher dose.    Dispense:  2 mL    Refill:  0   No orders of the defined types were placed in this encounter.    Return in  about 4 weeks (around 11/04/2022).     Montel Culver, MD   Primary Care Sports Medicine [place]

## 2022-10-07 NOTE — Patient Instructions (Signed)
-   Take 0.25 mg Wegovy dose weekly - Return in 1 month

## 2022-10-07 NOTE — Assessment & Plan Note (Signed)
Patient presenting with desire for weight loss, has been making lifestyle changes, does demonstrate interval weight loss of 4 pounds.  We discussed pharmacotherapeutic options and she is amenable to [facility].  Need for titration, risk profile reviewed, and close follow-up advised.  Initiate at 0.25 mg x 4 weeks, second month dose of 0.5 mg sent to pharmacy given current shortage.  Plan for 1 month follow-up to evaluate tolerance, weight, and next steps.

## 2022-10-10 ENCOUNTER — Encounter: Payer: Self-pay | Admitting: Family Medicine

## 2022-10-10 NOTE — Telephone Encounter (Signed)
Please advise 

## 2022-10-13 ENCOUNTER — Other Ambulatory Visit: Payer: Self-pay | Admitting: Family Medicine

## 2022-10-13 DIAGNOSIS — Z7689 Persons encountering health services in other specified circumstances: Secondary | ICD-10-CM

## 2022-10-13 MED ORDER — TOPIRAMATE 25 MG PO TABS: 25.0000 mg | ORAL_TABLET | Freq: Every day | ORAL | 0 refills | Status: DC

## 2022-10-13 MED ORDER — PHENTERMINE HCL 15 MG PO CAPS: 15.0000 mg | ORAL_CAPSULE | ORAL | 0 refills | Status: DC

## 2022-10-13 NOTE — Telephone Encounter (Signed)
Please advise 

## 2022-11-11 ENCOUNTER — Ambulatory Visit: Payer: BC Managed Care – PPO | Admitting: Family Medicine

## 2022-11-11 ENCOUNTER — Encounter: Payer: Self-pay | Admitting: Family Medicine

## 2022-11-11 VITALS — BP 120/80 | HR 100 | Ht 65.0 in | Wt 251.0 lb

## 2022-11-11 DIAGNOSIS — Z7689 Persons encountering health services in other specified circumstances: Secondary | ICD-10-CM | POA: Diagnosis not present

## 2022-11-11 DIAGNOSIS — R21 Rash and other nonspecific skin eruption: Secondary | ICD-10-CM | POA: Diagnosis not present

## 2022-11-11 MED ORDER — TRIAMCINOLONE ACETONIDE 0.1 % EX OINT: 1.0000 | TOPICAL_OINTMENT | Freq: Two times a day (BID) | CUTANEOUS | 0 refills | Status: DC

## 2022-11-11 MED ORDER — PHENTERMINE HCL 15 MG PO CAPS: 30.0000 mg | ORAL_CAPSULE | ORAL | 0 refills | Status: DC

## 2022-11-11 MED ORDER — TOPIRAMATE 50 MG PO TABS: 50.0000 mg | ORAL_TABLET | Freq: Every day | ORAL | 1 refills | Status: DC

## 2022-11-16 DIAGNOSIS — R21 Rash and other nonspecific skin eruption: Secondary | ICD-10-CM | POA: Insufficient documentation

## 2022-11-16 NOTE — Assessment & Plan Note (Signed)
Unable to obtain [facility], has tolerated phentermine, will titrate as patient can tolerate, adjunct lifestyle encouraged. She will also utilize Topamax and contact our office for refills and to coordinate follow-up.

## 2022-11-16 NOTE — Progress Notes (Signed)
     Primary Care / Sports Medicine Office Visit  Patient Information:  Patient ID: Caroline Mendoza, female DOB: 07/18/1991 Age: 31 y.o. MRN: [id number]   Caroline Mendoza is a pleasant 31 y.o. female presenting with the following:  Chief Complaint  Patient presents with   Weight Management Screening    Vitals:   11/11/22 1522  BP: 120/80  Pulse: 100  SpO2: 99%   Vitals:   11/11/22 1522  Weight: 251 lb (113.9 kg)  Height: 5\' 5"  (1.651 m)   Body mass index is 41.77 kg/m.  No results found.   Independent interpretation of notes and tests performed by another provider:   None  Procedures performed:   None  Pertinent History, Exam, Impression, and Recommendations:   Problem List Items Addressed This Visit       Musculoskeletal and Integument   Rash of hand - Primary    Rash in setting of severe dry skin, extremely pruritic. Topical emmollient advised, if symptomatic despite this, steroid has been prescribed.        Other   Encounter for weight management    Unable to obtain Wegovy, has tolerated phentermine, will titrate as patient can tolerate, adjunct lifestyle encouraged. She will also utilize Topamax and contact our office for refills and to coordinate follow-up.      Relevant Medications   topiramate (TOPAMAX) 50 MG tablet   phentermine 15 MG capsule     Orders & Medications Meds ordered this encounter  Medications   topiramate (TOPAMAX) 50 MG tablet    Sig: Take 1 tablet (50 mg total) by mouth daily.    Dispense:  30 tablet    Refill:  1   phentermine 15 MG capsule    Sig: Take 2 capsules (30 mg total) by mouth every morning.    Dispense:  60 capsule    Refill:  0   triamcinolone ointment (KENALOG) 0.1 %    Sig: Apply 1 Application topically 2 (two) times daily. To affected areas    Dispense:  60 g    Refill:  0   No orders of the defined types were placed in this encounter.    No follow-ups on file.      , MD, [facility]   Primary Care Sports Medicine Primary Care and Sports Medicine at [facility]

## 2022-11-16 NOTE — Patient Instructions (Addendum)
-   Increase phentermine as discussed - Dose Topamax - Use topical cream as directed if symptoms do not respond to aquaphor  - Contact for medication refills and to determine follow-up

## 2022-11-16 NOTE — Assessment & Plan Note (Signed)
Rash in setting of severe dry skin, extremely pruritic. Topical emmollient advised, if symptomatic despite this, steroid has been prescribed.

## 2022-11-28 ENCOUNTER — Encounter: Payer: Self-pay | Admitting: Family Medicine

## 2022-11-28 ENCOUNTER — Other Ambulatory Visit: Payer: Self-pay | Admitting: Family Medicine

## 2022-11-28 DIAGNOSIS — Z7689 Persons encountering health services in other specified circumstances: Secondary | ICD-10-CM

## 2022-11-28 MED ORDER — PHENTERMINE HCL 15 MG PO CAPS: 30.0000 mg | ORAL_CAPSULE | ORAL | 0 refills | Status: DC

## 2022-11-28 MED ORDER — TOPIRAMATE 50 MG PO TABS: 50.0000 mg | ORAL_TABLET | Freq: Every day | ORAL | 0 refills | Status: DC

## 2022-11-28 NOTE — Telephone Encounter (Signed)
There is 1 refill on the Topamax but none on the Phentermine.

## 2023-01-03 ENCOUNTER — Encounter: Payer: Self-pay | Admitting: Family Medicine

## 2023-01-05 ENCOUNTER — Ambulatory Visit: Payer: BC Managed Care – PPO | Admitting: Family Medicine

## 2023-01-05 ENCOUNTER — Encounter: Payer: Self-pay | Admitting: Family Medicine

## 2023-01-05 VITALS — BP 120/80 | HR 110 | Wt 240.0 lb

## 2023-01-05 DIAGNOSIS — Z7689 Persons encountering health services in other specified circumstances: Secondary | ICD-10-CM | POA: Diagnosis not present

## 2023-01-05 DIAGNOSIS — K649 Unspecified hemorrhoids: Secondary | ICD-10-CM

## 2023-01-05 MED ORDER — PHENTERMINE HCL 30 MG PO CAPS: 30.0000 mg | ORAL_CAPSULE | ORAL | 0 refills | Status: DC

## 2023-01-05 MED ORDER — TOPIRAMATE 50 MG PO TABS: 50.0000 mg | ORAL_TABLET | Freq: Two times a day (BID) | ORAL | 0 refills | Status: DC

## 2023-01-05 NOTE — Patient Instructions (Addendum)
-  Take 1 capsule of phentermine (30 mg) daily 30 minutes before breakfast (same as before) - Start taking topiramate 50 mg (new dose) twice daily (new frequency) - Start daily fiber supplement with target of 1 smooth bowel movement daily - Return in 1 month

## 2023-01-05 NOTE — Assessment & Plan Note (Signed)
Patient has demonstrated steady weight loss, tolerating medications well, has noted appetite suppression but still finds it very difficult to limit foods that she finds very appealing.  Will refrain from further phentermine titration however will titrate topiramate from 50 mg daily to twice daily dosing.  Close follow-up in 1 month.  I have strongly encouraged tight bowel controlled with OTC formulations.

## 2023-01-05 NOTE — Progress Notes (Signed)
     Primary Care / Sports Medicine Office Visit  Patient Information:  Patient ID: Caroline Mendoza, female DOB: February 01, 1991 Age: 32 y.o. MRN: [id number]   Jyra Lagares Nilda Keathley is a pleasant 31 y.o. female presenting with the following:  No chief complaint on file.   Vitals:   01/05/23 1548  BP: 120/80  Pulse: (!) 110  SpO2: 98%   Vitals:   01/05/23 1548  Weight: 240 lb (108.9 kg)   Body mass index is 39.94 kg/m.  No results found.   Independent interpretation of notes and tests performed by another provider:   None  Procedures performed:   None  Pertinent History, Exam, Impression, and Recommendations:   Encounter for weight management Overview:    01/05/2023    3:48 PM 11/11/2022    3:22 PM 10/07/2022    2:38 PM  Vitals with BMI  Height  5\' 5"  5\' 5"   Weight 240 lbs 251 lbs 253 lbs  BMI  27.74 12.8  Systolic 786 767 209  Diastolic 80 80 80  Pulse 470 100 98     Assessment & Plan: Patient has demonstrated steady weight loss, tolerating medications well, has noted appetite suppression but still finds it very difficult to limit foods that she finds very appealing.  Will refrain from further phentermine titration however will titrate topiramate from 50 mg daily to twice daily dosing.  Close follow-up in 1 month.  I have strongly encouraged tight bowel controlled with OTC formulations.  Orders: -     Phentermine HCl; Take 1 capsule (30 mg total) by mouth every morning.  Dispense: 30 capsule; Refill: 0 -     Topiramate; Take 1 tablet (50 mg total) by mouth 2 (two) times daily.  Dispense: 60 tablet; Refill: 0  Hemorrhoids, unspecified hemorrhoid type Assessment & Plan: Not currently symptomatic, is still noting constipation.  I have strongly encouraged bowel regimen to avoid recurrence.      Orders & Medications Meds ordered this encounter  Medications   phentermine 30 MG capsule    Sig: Take 1 capsule (30 mg total) by mouth every  morning.    Dispense:  30 capsule    Refill:  0   topiramate (TOPAMAX) 50 MG tablet    Sig: Take 1 tablet (50 mg total) by mouth 2 (two) times daily.    Dispense:  60 tablet    Refill:  0   No orders of the defined types were placed in this encounter.    Return in about 4 weeks (around 02/02/2023).     Montel Culver, MD, [facility]   Primary Care Sports Medicine Primary Care and Sports Medicine at [facility]

## 2023-01-05 NOTE — Assessment & Plan Note (Signed)
Not currently symptomatic, is still noting constipation.  I have strongly encouraged bowel regimen to avoid recurrence.

## 2023-01-09 ENCOUNTER — Ambulatory Visit: Payer: BC Managed Care – PPO | Admitting: Family Medicine

## 2023-01-16 ENCOUNTER — Other Ambulatory Visit: Payer: Self-pay | Admitting: Family Medicine

## 2023-01-16 ENCOUNTER — Encounter: Payer: Self-pay | Admitting: Family Medicine

## 2023-01-16 MED ORDER — PHENTERMINE HCL 15 MG PO CAPS: 15.0000 mg | ORAL_CAPSULE | ORAL | 0 refills | Status: DC

## 2023-01-16 NOTE — Telephone Encounter (Signed)
Please advise 

## 2023-02-10 ENCOUNTER — Encounter: Payer: Self-pay | Admitting: Family Medicine

## 2023-02-10 ENCOUNTER — Ambulatory Visit: Payer: BC Managed Care – PPO | Admitting: Family Medicine

## 2023-02-10 VITALS — BP 128/78 | HR 102 | Ht 65.0 in | Wt 233.0 lb

## 2023-02-10 DIAGNOSIS — K644 Residual hemorrhoidal skin tags: Secondary | ICD-10-CM | POA: Diagnosis not present

## 2023-02-10 DIAGNOSIS — Z7689 Persons encountering health services in other specified circumstances: Secondary | ICD-10-CM

## 2023-02-10 MED ORDER — PHENTERMINE HCL 15 MG PO CAPS: 15.0000 mg | ORAL_CAPSULE | ORAL | 0 refills | Status: DC

## 2023-02-10 MED ORDER — TOPIRAMATE 50 MG PO TABS: 50.0000 mg | ORAL_TABLET | Freq: Every day | ORAL | 0 refills | Status: DC

## 2023-02-10 NOTE — Assessment & Plan Note (Signed)
1 episode of recurrence noted in the setting of chronic constipation.  Has since resolved without intervention, stressed importance of addressing comorbid constipation, plan as follows: - Continue fiber Gummies and adequate water intake throughout the day - Start MiraLAX, 1 scoop daily, increase by 1 scoop every 2-3 days to target having at least 1-2 smooth bowel movements daily - If no bowel movement after 2 days, dose Dulcolax - If still no bowel movement despite the above, use fleets enema

## 2023-02-10 NOTE — Assessment & Plan Note (Signed)
Continues to note excellent steady weight loss, tolerating phentermine 15 mg and has self titrated topiramate 50 mg daily, tolerating without issues other than constipation, episode of symptomatic hemorrhoids. See additional assessment(s) for plan details.  Continue current dose with modification of topiramate to 50 mg daily, status update in 1 month/refills, follow-up in 3 months.

## 2023-02-10 NOTE — Assessment & Plan Note (Signed)
>>  ASSESSMENT AND PLAN FOR EXTERNAL HEMORRHOID, BLEEDING WRITTEN ON 02/10/2023  4:32 PM BY Elzia Hott, Ocie Bob, MD  1 episode of recurrence noted in the setting of chronic constipation.  Has since resolved without intervention, stressed importance of addressing comorbid constipation, plan as follows: - Continue fiber Gummies and adequate water intake throughout the day - Start MiraLAX, 1 scoop daily, increase by 1 scoop every 2-3 days to target having at least 1-2 smooth bowel movements daily - If no bowel movement after 2 days, dose Dulcolax - If still no bowel movement despite the above, use fleets enema

## 2023-02-10 NOTE — Patient Instructions (Addendum)
-   Continue current phentermine and new dosing of  topiramate (now only once daily) - Continue healthy lifestyle changes by increasing activity and keeping a broad variety of healthy food sources - Continue fiber Gummies and adequate water intake throughout the day - Start MiraLAX, 1 scoop daily, increase by 1 scoop every 2-3 days to target having at least 1-2 smooth bowel movements daily - If no bowel movement after 2 days, dose Dulcolax - If still no bowel movement despite the above, use fleets enema - Review information provided - Contact our office in 1 month for refills or questions anytime - Return for follow-up in 3 months

## 2023-02-10 NOTE — Progress Notes (Signed)
     Primary Care / Sports Medicine Office Visit  Patient Information:  Patient ID: Caroline Mendoza, female DOB: 1991/01/25 Age: 32 y.o. MRN: [id number]   Caroline Mendoza is a pleasant 32 y.o. female presenting with the following:  Chief Complaint  Patient presents with   Weight Check    Vitals:   02/10/23 1553  BP: 128/78  Pulse: (!) 102  SpO2: 100%   Vitals:   02/10/23 1553  Weight: 233 lb (105.7 kg)  Height: '5\' 5"'$  (1.651 m)   Body mass index is 38.77 kg/m.  No results found.   Independent interpretation of notes and tests performed by another provider:   None  Procedures performed:   None  Pertinent History, Exam, Impression, and Recommendations:   Caroline Mendoza was seen today for weight check.  External hemorrhoid, bleeding Assessment & Plan: 1 episode of recurrence noted in the setting of chronic constipation.  Has since resolved without intervention, stressed importance of addressing comorbid constipation, plan as follows: - Continue fiber Gummies and adequate water intake throughout the day - Start MiraLAX, 1 scoop daily, increase by 1 scoop every 2-3 days to target having at least 1-2 smooth bowel movements daily - If no bowel movement after 2 days, dose Dulcolax - If still no bowel movement despite the above, use fleets enema   Encounter for weight management Overview:    02/10/2023    3:53 PM 01/05/2023    3:48 PM 11/11/2022    3:22 PM  Vitals with BMI  Height '5\' 5"'$   '5\' 5"'$   Weight 233 lbs 240 lbs 251 lbs  BMI [id number]  [id number]  Systolic 0000000 [id number]  Diastolic 78 80 80  Pulse [id number] 110 100     Assessment & Plan: Continues to note excellent steady weight loss, tolerating phentermine 15 mg and has self titrated topiramate 50 mg daily, tolerating without issues other than constipation, episode of symptomatic hemorrhoids. See additional assessment(s) for plan details.  Continue current dose with modification of topiramate to 50 mg  daily, status update in 1 month/refills, follow-up in 3 months.  Orders: -     Phentermine HCl; Take 1 capsule (15 mg total) by mouth every morning.  Dispense: 30 capsule; Refill: 0 -     Topiramate; Take 1 tablet (50 mg total) by mouth daily.  Dispense: 90 tablet; Refill: 0     Orders & Medications Meds ordered this encounter  Medications   phentermine 15 MG capsule    Sig: Take 1 capsule (15 mg total) by mouth every morning.    Dispense:  30 capsule    Refill:  0   topiramate (TOPAMAX) 50 MG tablet    Sig: Take 1 tablet (50 mg total) by mouth daily.    Dispense:  90 tablet    Refill:  0   No orders of the defined types were placed in this encounter.    Return in about 3 months (around 05/13/2023).     Montel Culver, MD, [facility]   Primary Care Sports Medicine Primary Care and Sports Medicine at [facility]

## 2023-02-20 ENCOUNTER — Encounter: Payer: Self-pay | Admitting: Family Medicine

## 2023-02-20 NOTE — Telephone Encounter (Signed)
Please advise 

## 2023-03-16 ENCOUNTER — Other Ambulatory Visit: Payer: Self-pay

## 2023-03-16 ENCOUNTER — Encounter: Payer: Self-pay | Admitting: Family Medicine

## 2023-03-16 DIAGNOSIS — K644 Residual hemorrhoidal skin tags: Secondary | ICD-10-CM

## 2023-03-16 NOTE — Telephone Encounter (Signed)
Referral placed.

## 2023-03-16 NOTE — Telephone Encounter (Signed)
Please advise 

## 2023-03-17 NOTE — Telephone Encounter (Signed)
Please advise 

## 2023-03-23 ENCOUNTER — Other Ambulatory Visit: Payer: Self-pay | Admitting: Family Medicine

## 2023-03-23 DIAGNOSIS — Z7689 Persons encountering health services in other specified circumstances: Secondary | ICD-10-CM

## 2023-03-23 NOTE — Telephone Encounter (Signed)
Please review.  KP

## 2023-03-24 ENCOUNTER — Encounter: Payer: Self-pay | Admitting: Family Medicine

## 2023-03-24 MED ORDER — TOPIRAMATE 50 MG PO TABS: 50.0000 mg | ORAL_TABLET | Freq: Every day | ORAL | 0 refills | Status: DC

## 2023-03-24 MED ORDER — PHENTERMINE HCL 15 MG PO CAPS: 15.0000 mg | ORAL_CAPSULE | ORAL | 0 refills | Status: DC

## 2023-03-24 NOTE — Telephone Encounter (Signed)
Please advise 

## 2023-04-04 ENCOUNTER — Emergency Department
Admission: EM | Admit: 2023-04-04 | Discharge: 2023-04-05 | Disposition: A | Discharge status: Home / Self Care | Payer: BC Managed Care – PPO | Attending: Emergency Medicine | Admitting: Emergency Medicine

## 2023-04-04 ENCOUNTER — Other Ambulatory Visit: Payer: Self-pay

## 2023-04-04 DIAGNOSIS — N390 Urinary tract infection, site not specified: Secondary | ICD-10-CM | POA: Diagnosis not present

## 2023-04-04 DIAGNOSIS — R1031 Right lower quadrant pain: Secondary | ICD-10-CM | POA: Diagnosis not present

## 2023-04-04 DIAGNOSIS — D72829 Elevated white blood cell count, unspecified: Secondary | ICD-10-CM | POA: Insufficient documentation

## 2023-04-04 DIAGNOSIS — R109 Unspecified abdominal pain: Secondary | ICD-10-CM | POA: Diagnosis not present

## 2023-04-04 DIAGNOSIS — T8384XA Pain from genitourinary prosthetic devices, implants and grafts, initial encounter: Secondary | ICD-10-CM | POA: Insufficient documentation

## 2023-04-04 LAB — URINALYSIS, ROUTINE W REFLEX MICROSCOPIC
Bilirubin Urine: NEGATIVE
Glucose, UA: NEGATIVE mg/dL
Ketones, ur: NEGATIVE mg/dL
Nitrite: NEGATIVE
Protein, ur: 100 mg/dL — AB
Specific Gravity, Urine: 1.013 (ref 1.005–1.030)
WBC, UA: >50 WBC/hpf (ref 0–5)
pH: 7 (ref 5.0–8.0)

## 2023-04-04 LAB — BASIC METABOLIC PANEL
Anion gap: 6 (ref 5–15)
BUN: 13 mg/dL (ref 6–20)
CO2: 23 mmol/L (ref 22–32)
Calcium: 9.1 mg/dL (ref 8.9–10.3)
Chloride: 106 mmol/L (ref 98–111)
Creatinine, Ser: 0.8 mg/dL (ref 0.44–1.00)
GFR, Estimated: >60 mL/min (ref >60)
Glucose, Bld: 106 mg/dL — ABNORMAL HIGH (ref 70–99)
Potassium: 4 mmol/L (ref 3.5–5.1)
Sodium: 135 mmol/L (ref 135–145)

## 2023-04-04 LAB — CBC
HCT: 43.2 % (ref 36.0–46.0)
Hemoglobin: 13.8 g/dL (ref 12.0–15.0)
MCH: 27.3 pg (ref 26.0–34.0)
MCHC: 31.9 g/dL (ref 30.0–36.0)
MCV: 85.5 fL (ref 80.0–100.0)
Platelets: 397 10*3/uL (ref 150–400)
RBC: 5.05 MIL/uL (ref 3.87–5.11)
RDW: 13.1 % (ref 11.5–15.5)
WBC: 10.6 10*3/uL — ABNORMAL HIGH (ref 4.0–10.5)
nRBC: 0 % (ref 0.0–0.2)

## 2023-04-04 LAB — POC URINE PREG, ED: Preg Test, Ur: NEGATIVE

## 2023-04-04 LAB — LIPASE, BLOOD: Lipase: 43 U/L (ref 11–51)

## 2023-04-04 NOTE — ED Triage Notes (Signed)
Pt presents to ER with c/o lower abd pain that has been ongoing for around one month, and vaginal bleeding that started last week when her menstrual cycle started, but has been going on past when it should have started.  Pt states she does not think she is pregnant at this time.  Pt also states she has had some bright red rectal bleeding, with a hx of hemorrhoids.  Endorses hx of ovarian cysts to right ovary.  Pt does endorse some difficulty urinating.  Pt is otherwise A&O x4 and in NAD at this time.

## 2023-04-05 ENCOUNTER — Emergency Department: Payer: BC Managed Care – PPO

## 2023-04-05 DIAGNOSIS — R109 Unspecified abdominal pain: Secondary | ICD-10-CM | POA: Diagnosis not present

## 2023-04-05 LAB — HEPATIC FUNCTION PANEL
ALT: 18 U/L (ref 0–44)
AST: 16 U/L (ref 15–41)
Albumin: 3.6 g/dL (ref 3.5–5.0)
Alkaline Phosphatase: 69 U/L (ref 38–126)
Bilirubin, Direct: <0.1 mg/dL (ref 0.0–0.2)
Total Bilirubin: 0.4 mg/dL (ref 0.3–1.2)
Total Protein: 7.5 g/dL (ref 6.5–8.1)

## 2023-04-05 MED ORDER — ONDANSETRON HCL 4 MG/2ML IJ SOLN
4.0000 mg | INTRAMUSCULAR | Status: AC
  Administered 2023-04-05: 4 mg via INTRAVENOUS
  Filled 2023-04-05: qty 2

## 2023-04-05 MED ORDER — IOHEXOL 300 MG/ML  SOLN
100.0000 mL | Freq: Once | INTRAMUSCULAR | Status: AC | PRN
  Administered 2023-04-05: 100 mL via INTRAVENOUS

## 2023-04-05 MED ORDER — CEFADROXIL 500 MG PO CAPS: 1000.0000 mg | ORAL_CAPSULE | Freq: Two times a day (BID) | ORAL | 0 refills | Status: AC

## 2023-04-05 MED ORDER — ONDANSETRON 4 MG PO TBDP
ORAL_TABLET | ORAL | 0 refills | Status: AC
Start: 2023-04-05 — End: ?

## 2023-04-05 MED ORDER — CEPHALEXIN 500 MG PO CAPS
500.0000 mg | ORAL_CAPSULE | Freq: Once | ORAL | Status: AC
  Administered 2023-04-05: 500 mg via ORAL
  Filled 2023-04-05: qty 1

## 2023-04-05 MED ORDER — KETOROLAC TROMETHAMINE 30 MG/ML IJ SOLN
15.0000 mg | Freq: Once | INTRAMUSCULAR | Status: AC
  Administered 2023-04-05: 15 mg via INTRAVENOUS
  Filled 2023-04-05: qty 1

## 2023-04-05 NOTE — ED Provider Notes (Signed)
[facility] Provider Note    Event Date/Time   First MD Initiated Contact with Patient 04/04/23 2350     (approximate)   History   Abdominal Pain and Vaginal Bleeding   HPI Caroline Mendoza is a 32 y.o. female who presents for right lower quadrant pain that has been going on for about a month.  She said that she has seen her primary care doctor and has an appointment in 2 days to see a gastroenterologist because she is also having some rectal bleeding (extensive history of known hemorrhoids).  She said that she was worried because the pain was worse tonight and she did not think she should wait until Thursday when she has her GI appointment.  Occasional nausea, no vomiting.  Blood when she has a bowel movement.  Irregular menstrual cycle over the last month which is atypical for her.  No concerns for STDs.  No vaginal pain or discharge.  No recent fever, chest pain, nor shortness of breath.     Physical Exam   Triage Vital Signs: ED Triage Vitals [04/04/23 2027]  Enc Vitals Group     BP (!) 143/103     Pulse Rate (!) 107     Resp 17     Temp 98.3 F (36.8 C)     Temp Source Oral     SpO2 100 %     Weight 99.8 kg (220 lb)     Height 1.651 m ( )     Head Circumference      Peak Flow      Pain Score 10     Pain Loc      Pain Edu?      Excl. in GC?     Most recent vital signs: Vitals:   04/04/23 2027 04/05/23 0113  BP: (!) 143/103 102/68  Pulse: (!) 107 82  Resp: 17 16  Temp: 98.3 F (36.8 C) 98.4 F (36.9 C)  SpO2: 100% 97%    General: Awake, no distress.  CV:  Good peripheral perfusion.  Regular rate and rhythm. Resp:  Normal effort. Speaking easily and comfortably, no accessory muscle usage nor intercostal retractions.   Abd:  No distention.  Mild tenderness to palpation of the right lower quadrant, no guarding, no right upper quadrant or epigastric tenderness.   ED Results / Procedures / Treatments   Labs (all  labs ordered are listed, but only abnormal results are displayed) Labs Reviewed  CBC - Abnormal; Notable for the following components:      Result Value   WBC 10.6 (*)    All other components within normal limits  BASIC METABOLIC PANEL - Abnormal; Notable for the following components:   Glucose, Bld 106 (*)    All other components within normal limits  URINALYSIS, ROUTINE W REFLEX MICROSCOPIC - Abnormal; Notable for the following components:   Color, Urine AMBER (*)    APPearance CLOUDY (*)    Hgb urine dipstick LARGE (*)    Protein, ur 100 (*)    Leukocytes,Ua LARGE (*)    Bacteria, UA RARE (*)    All other components within normal limits  URINE CULTURE  LIPASE, BLOOD  HEPATIC FUNCTION PANEL  POC URINE PREG, ED     RADIOLOGY I viewed and interpreted the patient's CT of the abdomen and pelvis.  See hospital course for details: No acute abnormalities   PROCEDURES:  Critical Care performed: No  Procedures    IMPRESSION /  MDM / ASSESSMENT AND PLAN / ED COURSE  I reviewed the triage vital signs and the nursing notes.                              Differential diagnosis includes, but is not limited to, musculoskeletal/chronic abdominal pain, ovarian cyst, appendicitis, diverticulitis, neoplasm, STD/PID, UTI/pyelonephritis  Patient's presentation is most consistent with acute presentation with potential threat to life or bodily function.  Labs/studies ordered: Urinalysis, urine culture, BMP, hepatic function test, lipase, CBC, urine pregnancy test, CT abdomen pelvis with IV contrast  Interventions/Medications given:  Medications  cephALEXin (KEFLEX) capsule 500 mg (has no administration in time range)  iohexol (OMNIPAQUE) 300 MG/ML solution 100 mL (100 mLs Intravenous Contrast Given 04/05/23 0124)  ketorolac (TORADOL) 30 MG/ML injection 15 mg (15 mg Intravenous Given 04/05/23 0152)  ondansetron (ZOFRAN) injection 4 mg (4 mg Intravenous Given 04/05/23 0152)    (Note:   hospital course my include additional interventions and/or labs/studies not listed above.)   Vital signs stable.  Patient's symptoms have been going on for about a month and I think it is unlikely she has an acute infection, but given that the pain is worse now, it is possible that she could have developed appendicitis.  She has no concerns for STD/PID and has declined a pelvic exam.  She and I also talked about it and she declined a rectal exam, particularly given that she knows she has hemorrhoids and she has an appointment with a GI doctor in a couple of days.  I think both of these plans are reasonable and appropriate.  Urinalysis is suggestive of UTI which could explain her symptoms.  Very minimal leukocytosis of 10.6 and urine pregnancy is negative.  Urine culture is pending and hepatic function is pending at this time but she has no right upper quadrant tenderness nor epigastric tenderness.  Given the tenderness to palpation in the right lower quadrant, the plan is to obtain a CT of the abdomen and pelvis, and if no acute abnormalities, she will be discharged for outpatient follow-up.  She agrees with the plan.  Toradol 15 mg IV and Zofran 4 mg IV for her symptoms.   Clinical Course as of 04/05/23 0225  Wed Apr 05, 2023  0135 Hepatic function panel Normal LFTs [CF]  0223 CT ABDOMEN PELVIS W CONTRAST I viewed and interpreted the patient's CT scan.  I see no acute abnormalities including no evidence of appendicitis and no obvious adnexal masses.  Radiologist confirmed no acute findings.  Patient is feeling better.  I recommended close outpatient follow-up and the use of over-the-counter analgesics.  She understands and agrees with the plan.  I gave my usual return precautions. [CF]  0224 I also updated her about the urinary tract infection and plan for care and she agrees. [CF]    Clinical Course User Index [CF] Loleta Rose, MD     FINAL CLINICAL IMPRESSION(S) / ED DIAGNOSES    Final diagnoses:  RLQ abdominal pain  Urinary tract infection without hematuria, site unspecified     Rx / DC Orders   ED Discharge Orders          Ordered    cefadroxil (DURICEF) 500 MG capsule  2 times daily        04/05/23 0225    ondansetron (ZOFRAN-ODT) 4 MG disintegrating tablet        04/05/23 0225  Note:  This document was prepared using Dragon voice recognition software and may include unintentional dictation errors.   Loleta Rose, MD 04/05/23 [phone]

## 2023-04-06 ENCOUNTER — Encounter: Payer: Self-pay | Admitting: Gastroenterology

## 2023-04-06 ENCOUNTER — Ambulatory Visit: Payer: BC Managed Care – PPO | Admitting: Gastroenterology

## 2023-04-06 ENCOUNTER — Telehealth: Payer: Self-pay

## 2023-04-06 VITALS — BP 128/88 | HR 91 | Temp 98.5°F | Ht 65.0 in | Wt 229.0 lb

## 2023-04-06 DIAGNOSIS — K921 Melena: Secondary | ICD-10-CM | POA: Diagnosis not present

## 2023-04-06 DIAGNOSIS — K5909 Other constipation: Secondary | ICD-10-CM | POA: Diagnosis not present

## 2023-04-06 NOTE — Transitions of Care (Post Inpatient/ED Visit) (Signed)
   04/06/2023  Name: Caroline Mendoza MRN: [id number] DOB: 04/21/1991  Today's TOC FU Call Status: Today's TOC FU Call Status:: Successful TOC FU Call Competed TOC FU Call Complete Date: 04/06/23  Transition Care Management Follow-up Telephone Call Date of Discharge: 04/05/23 Discharge Facility: [facility] Type of Discharge: Emergency Department Reason for ED Visit: Other: (RLQ abdominal pain) How have you been since you were released from the hospital?: Same Any questions or concerns?: No  Items Reviewed: Did you receive and understand the discharge instructions provided?: Yes Medications obtained and verified?: Yes (Medications Reviewed) Any new allergies since your discharge?: No Dietary orders reviewed?: NA Do you have support at home?: Yes  Home Care and Equipment/Supplies: Were Home Health Services Ordered?: NA Any new equipment or medical supplies ordered?: NA  Functional Questionnaire: Do you need assistance with bathing/showering or dressing?: No Do you need assistance with meal preparation?: No Do you need assistance with eating?: No Do you have difficulty maintaining continence: No Do you need assistance with getting out of bed/getting out of a chair/moving?: No Do you have difficulty managing or taking your medications?: No  Follow up appointments reviewed: PCP Follow-up appointment confirmed?: NA Specialist Hospital Follow-up appointment confirmed?: Yes Date of Specialist follow-up appointment?: 04/06/23 Follow-Up Specialty Provider:: Dr. Tobi Bastos- Gastroenterology Do you need transportation to your follow-up appointment?: No Do you understand care options if your condition(s) worsen?: Yes-patient verbalized understanding    SIGNATURE  Agnes Lawrence, CMA (AAMA)  [facility] [phone]

## 2023-04-06 NOTE — Progress Notes (Signed)
Wyline Mood MD, MRCP(U.K) [address]  [address]  [place], [place] [address]  Main: [phone]  Fax: [phone]   Gastroenterology Consultation  Referring Provider:     Jerrol Banana, MD Primary Care Physician:  Jerrol Banana, MD Primary Gastroenterologist:  Dr. Wyline Mood  Reason for Consultation: Hemorrhoids        HPI:   Caroline Mendoza is a 32 y.o. y/o female here today to see me for hemorrhoids.  History of constipation.  Seen in the ER on 04/04/2023 for right lower quadrant pain going on for a month she underwent a CT scan of the abdomen and pelvis with contrast that showed no abnormalities.  Hemoglobin was 13.8 g LFTs were normal urine analysis showed 40,000 colonies with Enterococcus faecalis and Staphylococcus hemolyticus urine analysis showed large amount of blood leukocytes urine pregnancy test was negative was just discharged on antibiotics for UTI.  09/17/2021 she underwent a colonoscopy by Dr. Maximino Greenland for hematochezia and was found to have hemorrhoids externally as well as internally.  She states that she has been having issues with blood on the tissue paper ongoing for a while.  Associated with constipation.  She has developed constipation after starting weight loss medication.  Does not have a bowel movement sometimes for days.  Was suggested to try MiraLAX but has not yet tried it.  No other complaints.  Past Medical History:  Diagnosis Date   Hemorrhoids     Past Surgical History:  Procedure Laterality Date   COLONOSCOPY WITH PROPOFOL N/A 09/17/2021   Procedure: COLONOSCOPY WITH PROPOFOL;  Surgeon: Pasty Spillers, MD;  Location: [facility] ENDOSCOPY;  Service: Endoscopy;  Laterality: N/A;   WISDOM TOOTH EXTRACTION      Prior to Admission medications   Medication Sig Start Date End Date Taking? Authorizing Provider  cefadroxil (DURICEF) 500 MG capsule Take 2 capsules (1,000 mg total) by mouth 2 (two) times daily for 7 days.  04/05/23 04/12/23  Loleta Rose, MD  norethindrone-ethinyl estradiol (LOESTRIN) 1-20 MG-MCG tablet Take by mouth. 11/29/21 11/29/22  [provider]  ondansetron (ZOFRAN-ODT) 4 MG disintegrating tablet Allow 1-2 tablets to dissolve in your mouth every 8 hours as needed for nausea/vomiting 04/05/23   Loleta Rose, MD  phentermine 15 MG capsule Take 1 capsule (15 mg total) by mouth every morning. 03/24/23   Jerrol Banana, MD  topiramate (TOPAMAX) 50 MG tablet Take 1 tablet (50 mg total) by mouth daily. 03/24/23   Jerrol Banana, MD  triamcinolone ointment (KENALOG) 0.1 % Apply 1 Application topically 2 (two) times daily. To affected areas 11/11/22   Jerrol Banana, MD    Family History  Problem Relation Age of Onset   Healthy Mother    Heart attack Father        2000   Stroke Father        2020   Healthy Sister    Healthy Brother    Healthy Sister      Social History   Tobacco Use   Smoking status: Never   Smokeless tobacco: Never  Vaping Use   Vaping Use: Never used  Substance Use Topics   Alcohol use: Not Currently   Drug use: Never    Allergies as of 04/06/2023   (No Known Allergies)    Review of Systems:    All systems reviewed and negative except where noted in HPI.   Physical Exam:  BP 128/88   Pulse 91  Temp 98.5 F (36.9 C) (Oral)   Ht $Remov 1.651 m)   Wt 229 lb (103.9 kg)   LMP 03/21/2023 (Exact Date)   BMI 38.11 kg/m  Patient's last menstrual period was 03/21/2023 (exact date). Psych:  Alert and cooperative. Normal mood and affect. General:   Alert,  Well-developed, well-nourished, pleasant and cooperative in NAD Head:  Normocephalic and atraumatic. Eyes:  Sclera clear, no icterus.   Conjunctiva pink. Ears:  Normal auditory acuity.ithout masses, hepatosplenomegaly or hernias noted.  No guarding or rebound tenderness.    Neurologic:  Alert and oriented x3;  grossly normal neurologically. Psych:  Alert and cooperative. Normal mood and  affect.  Imaging Studies: CT ABDOMEN PELVIS W CONTRAST  Result Date: 04/05/2023 CLINICAL DATA:  RLQ abdominal pain EXAM: CT ABDOMEN AND PELVIS WITH CONTRAST TECHNIQUE: Multidetector CT imaging of the abdomen and pelvis was performed using the standard protocol following bolus administration of intravenous contrast. RADIATION DOSE REDUCTION: This exam was performed according to the departmental dose-optimization program which includes automated exposure control, adjustment of the mA and/or kV according to patient size and/or use of iterative reconstruction technique. CONTRAST:  OMNIPAQUE IOHEXOL 300 MG/ML  SOLN COMPARISON:  12/22/2019 FINDINGS: Lower chest: No acute findings Hepatobiliary: No focal hepatic abnormality. Gallbladder unremarkable. Pancreas: No focal abnormality or ductal dilatation. Spleen: No focal abnormality.  Normal size. Adrenals/Urinary Tract: No adrenal abnormality. No focal renal abnormality. No stones or hydronephrosis. Urinary bladder is unremarkable. Stomach/Bowel: Stomach, large and small bowel grossly unremarkable. Normal appendix. Vascular/Lymphatic: No evidence of aneurysm or adenopathy. Reproductive: Uterus and adnexa unremarkable.  No mass. Other: No free fluid or free air. Musculoskeletal: No acute bony abnormality. IMPRESSION: Normal appendix. No acute findings. Electronically Signed   By: Charlett Nose M.D.   On: 04/05/2023 01:45    Assessment and Plan:   Caroline Mendoza is a 32 y.o. y/o female has been referred for rectal bleeding.  Colonoscopy in 2022 by Dr. Maximino Greenland showed internal and external hemorrhoids.  Very likely her rectal bleeding presently is due to the hemorrhoids exacerbated by constipation.  She has not yet tried any medications for constipation.  Discussed options about treating the constipation with MiraLAX 1-2 times a day, discussed about conservative management of internal hemorrhoids including high-fiber diet perianal toileting  hygiene, sitz bath and options if it fails including banding patient information provided about hemorrhoidal banding.  Also discussed option of repeating colonoscopy which we can discuss further at next visit if her symptoms do continue.  Follow up in 6 to 8 weeks  Dr Wyline Mood MD,MRCP(U.K)

## 2023-04-06 NOTE — Patient Instructions (Signed)
How to Take a [employer] A sitz bath is a warm water bath that may be used to care for your rectum, genital area, or the area between your rectum and genitals (perineum). In a sitz bath, the water only comes up to your hips and covers your buttocks. A sitz bath may be done in a bathtub or with a portable sitz bath that fits over the toilet. Your health care provider may recommend a sitz bath to help: Relieve pain and discomfort after delivering a baby. Relieve pain and itching from hemorrhoids or anal fissures. Relieve pain after certain surgeries. Relax muscles that are sore or tight. How to take a sitz bath Take 2-4 sitz baths a day, or as many as told by your health care provider. Bathtub sitz bath To take a sitz bath in a bathtub: Partially fill a bathtub with warm water. The water should be deep enough to cover your hips and buttocks when you are sitting in the bathtub. Follow your health care provider's instructions if you are told to put medicine in the water. Sit in the water. Open the bathtub drain a little, and leave it open during your bath. Turn on the warm water again, enough to replace the water that is draining out. Keep the water running throughout your bath. This helps keep the water at the right level and temperature. Soak in the water for 15-20 minutes, or as long as told by your health care provider. When you are done, be careful when you stand up. You may feel dizzy. After the sitz bath, pat yourself dry. Do not rub your skin to dry it.  Over-the-toilet sitz bath To take a sitz bath with an over-the-toilet basin: Follow the manufacturer's instructions. Fill the basin with warm water. Follow your health care provider's instructions if you were told to put medicine in the water. Sit on the seat. Make sure the water covers your buttocks and perineum. Soak in the water for 15-20 minutes, or as long as told by your health care provider. After the sitz bath, pat yourself dry.  Do not rub your skin to dry it. Clean and dry the basin between uses. Discard the basin if it cracks, or according to the manufacturer's instructions.  Contact a health care provider if: Your pain or itching gets worse. Stop doing sitz baths if your symptoms get worse. You have new symptoms. Stop doing sitz baths until you talk with your health care provider. Summary A sitz bath is a warm water bath in which the water only comes up to your hips and covers your buttocks. Your health care provider may recommend a sitz bath to help relieve pain and discomfort after delivering a baby, relieve pain and itching from hemorrhoids or anal fissures, relieve pain after certain surgeries, or help to relax muscles that are sore or tight. Take 2-4 sitz baths a day, or as many as told by your health care provider. Soak in the water for 15-20 minutes. Stop doing sitz baths if your symptoms get worse. This information is not intended to replace advice given to you by your health care provider. Make sure you discuss any questions you have with your health care provider. Document Revised: 03/01/2022 Document Reviewed: 03/01/2022 Elsevier Patient Education  2023 Elsevier Inc. Hemorrhoids Hemorrhoids are swollen veins that may form: In the butt (rectum). These are called internal hemorrhoids. Around the opening of the butt (anus). These are called external hemorrhoids. Most hemorrhoids do not cause very bad problems.  They often get better with changes to your lifestyle and what you eat. What are the causes? Having trouble pooping (constipation) or watery poop (diarrhea). Pushing too hard when you poop. Pregnancy. Being very overweight (obese). Sitting for too long. Riding a bike for a long time. Heavy lifting or other things that take a lot of effort. Anal sex. What are the signs or symptoms? Pain. Itching or soreness in the butt. Bleeding from the butt. Leaking poop. Swelling. One or more lumps  around the opening of your butt. How is this treated? In most cases, hemorrhoids can be treated at home. You may be told to: Change what you eat. Make changes to your lifestyle. If these treatments do not help, you may need to have a procedure done. Your doctor may need to: Place rubber bands at the bottom of the hemorrhoids to make them fall off. Put medicine into the hemorrhoids to shrink them. Shine a type of light on the hemorrhoids to cause them to fall off. Do surgery to get rid of the hemorrhoids. Follow these instructions at home: Medicines Take over-the-counter and prescription medicines only as told by your doctor. Use creams with medicine in them or medicines that you put in your butt as told by your doctor. Eating and drinking  Eat foods that have a lot of fiber in them. These include whole grains, beans, nuts, fruits, and vegetables. Ask your doctor about taking products that have fiber added to them (fibersupplements). Take in less fat. You can do this by: Eating low-fat dairy products. Eating less red meat. Staying away from processed foods. Drink enough fluid to keep your pee (urine) pale yellow. Managing pain and swelling  Take a warm-water bath (sitz bath) for 20 minutes to ease pain. Do this 3-4 times a day. You may do this in a bathtub. You may also use a portable sitz bath that fits over the toilet. If told, put ice on the painful area. It may help to use ice between your warm baths. Put ice in a plastic bag. Place a towel between your skin and the bag. Leave the ice on for 20 minutes, 2-3 times a day. If your skin turns bright red, take off the ice right away to prevent skin damage. The risk of damage is higher if you cannot feel pain, heat, or cold. General instructions Exercise. Ask your doctor how much and what kind of exercise is best for you. Go to the bathroom when you need to poop. Do not wait. Try not to push too hard when you poop. Keep your butt dry  and clean. Use wet toilet paper or moist towelettes after you poop. Do not sit on the toilet for a long time. Contact a doctor if: You have pain and swelling that do not get better with treatment. You have trouble pooping. You cannot poop. You have pain or swelling outside the area of the hemorrhoids. Get help right away if: You have bleeding from the butt that will not stop. This information is not intended to replace advice given to you by your health care provider. Make sure you discuss any questions you have with your health care provider. Document Revised: 08/10/2022 Document Reviewed: 08/10/2022 Elsevier Patient Education  2023 [employer].

## 2023-04-07 LAB — URINE CULTURE: Culture: 40000 — AB

## 2023-05-09 ENCOUNTER — Encounter: Payer: Self-pay | Admitting: Family Medicine

## 2023-05-09 DIAGNOSIS — Z7689 Persons encountering health services in other specified circumstances: Secondary | ICD-10-CM

## 2023-05-09 MED ORDER — PHENTERMINE HCL 15 MG PO CAPS
15.0000 mg | ORAL_CAPSULE | ORAL | 0 refills | Status: DC
Start: 2023-05-09 — End: 2023-05-19

## 2023-05-09 NOTE — Telephone Encounter (Signed)
Can you refill? Telephone encounter sent

## 2023-05-18 ENCOUNTER — Ambulatory Visit: Payer: BC Managed Care – PPO | Admitting: Gastroenterology

## 2023-05-19 ENCOUNTER — Ambulatory Visit: Payer: BC Managed Care – PPO | Admitting: Family Medicine

## 2023-05-19 ENCOUNTER — Encounter: Payer: Self-pay | Admitting: Family Medicine

## 2023-05-19 VITALS — BP 118/84 | HR 101 | Ht 65.0 in | Wt 219.0 lb

## 2023-05-19 DIAGNOSIS — K644 Residual hemorrhoidal skin tags: Secondary | ICD-10-CM | POA: Diagnosis not present

## 2023-05-19 DIAGNOSIS — Z7689 Persons encountering health services in other specified circumstances: Secondary | ICD-10-CM

## 2023-05-19 DIAGNOSIS — K648 Other hemorrhoids: Secondary | ICD-10-CM

## 2023-05-19 MED ORDER — TOPIRAMATE 50 MG PO TABS: 50.0000 mg | ORAL_TABLET | Freq: Every day | ORAL | 0 refills | Status: DC

## 2023-05-19 MED ORDER — PHENTERMINE HCL 15 MG PO CAPS: 15.0000 mg | ORAL_CAPSULE | ORAL | 2 refills | Status: DC

## 2023-05-19 NOTE — Patient Instructions (Addendum)
-   Continue MiraLAX dosing with adjustments to target 1-2 smooth bowel movements daily - Continue healthy dietary changes to aid in smooth bowel movements - Continue current phentermine 15 mg daily - Restart topiramate 50 mg daily - Follow-up in 3 months

## 2023-05-19 NOTE — Progress Notes (Signed)
     Primary Care / Sports Medicine Office Visit  Patient Information:  Patient ID: Caroline Mendoza, female DOB: Jul 02, 1991 Age: 32 y.o. MRN: [id number]   Caroline Mendoza is a pleasant 32 y.o. female presenting with the following:  Chief Complaint  Patient presents with   Weight Check    Vitals:   05/19/23 1547  BP: 118/84  Pulse: (!) 101  SpO2: 95%   Vitals:   05/19/23 1547  Weight: 219 lb (99.3 kg)  Height: 5\' 5"  (1.651 m)   Body mass index is 36.44 kg/m.  No results found.   Independent interpretation of notes and tests performed by another provider:   None  Procedures performed:   None  Pertinent History, Exam, Impression, and Recommendations:   Caroline Mendoza was seen today for weight check.  Encounter for weight management Assessment & Plan: Patient has restarted phentermine and has been tolerating well without any cardiac or sleep related issues, previously noted GI issues have resolved following optimization of bowel routine.  She has demonstrated steady weight loss.  As such, we discussed titration versus maintenance dose, plan as follows: - Continue current phentermine 15 mg daily - Restart topiramate 50 mg daily - Follow-up in 3 months  Orders: -     Phentermine HCl; Take 1 capsule (15 mg total) by mouth every morning.  Dispense: 30 capsule; Refill: 2 -     Topiramate; Take 1 tablet (50 mg total) by mouth daily.  Dispense: 90 tablet; Refill: 0  Internal and external bleeding hemorrhoids Overview: 09/17/2021 she underwent a colonoscopy by Dr. Maximino Greenland for hematochezia and was found to have hemorrhoids externally as well as internally.   Assessment & Plan: Patient did have a chance to establish with GI, offered ligation and bowel regimen optimization.  Currently reporting 1 smooth bowel movement every 1-2 days, no additional hematochezia reported.  Plan as follows: - Continue MiraLAX dosing with adjustments to target 1-2 smooth  bowel movements daily - Continue healthy dietary changes to aid in smooth bowel movements      Orders & Medications Meds ordered this encounter  Medications   phentermine 15 MG capsule    Sig: Take 1 capsule (15 mg total) by mouth every morning.    Dispense:  30 capsule    Refill:  2   topiramate (TOPAMAX) 50 MG tablet    Sig: Take 1 tablet (50 mg total) by mouth daily.    Dispense:  90 tablet    Refill:  0   No orders of the defined types were placed in this encounter.    Return in about 3 months (around 08/19/2023).     Jerrol Banana, MD, [facility]   Primary Care Sports Medicine Primary Care and Sports Medicine at [facility]

## 2023-05-19 NOTE — Assessment & Plan Note (Signed)
Patient did have a chance to establish with GI, offered ligation and bowel regimen optimization.  Currently reporting 1 smooth bowel movement every 1-2 days, no additional hematochezia reported.  Plan as follows: - Continue MiraLAX dosing with adjustments to target 1-2 smooth bowel movements daily - Continue healthy dietary changes to aid in smooth bowel movements

## 2023-05-19 NOTE — Assessment & Plan Note (Signed)
Patient has restarted phentermine and has been tolerating well without any cardiac or sleep related issues, previously noted GI issues have resolved following optimization of bowel routine.  She has demonstrated steady weight loss.  As such, we discussed titration versus maintenance dose, plan as follows: - Continue current phentermine 15 mg daily - Restart topiramate 50 mg daily - Follow-up in 3 months

## 2023-06-12 ENCOUNTER — Encounter: Payer: Self-pay | Admitting: Family Medicine

## 2023-06-13 NOTE — Telephone Encounter (Signed)
Please advise 

## 2023-06-14 ENCOUNTER — Other Ambulatory Visit: Payer: Self-pay | Admitting: Family Medicine

## 2023-08-02 ENCOUNTER — Encounter: Payer: Self-pay | Admitting: Family Medicine

## 2023-08-02 NOTE — Telephone Encounter (Signed)
Please review.  KP

## 2023-08-18 ENCOUNTER — Encounter: Payer: Self-pay | Admitting: Family Medicine

## 2023-08-18 ENCOUNTER — Ambulatory Visit: Payer: BC Managed Care – PPO | Admitting: Family Medicine

## 2023-08-18 VITALS — BP 126/68 | HR 96 | Ht 65.0 in | Wt 209.0 lb

## 2023-08-18 DIAGNOSIS — Z7689 Persons encountering health services in other specified circumstances: Secondary | ICD-10-CM

## 2023-08-18 DIAGNOSIS — G478 Other sleep disorders: Secondary | ICD-10-CM

## 2023-09-08 DIAGNOSIS — G478 Other sleep disorders: Secondary | ICD-10-CM | POA: Insufficient documentation

## 2023-09-08 DIAGNOSIS — N912 Amenorrhea, unspecified: Secondary | ICD-10-CM | POA: Diagnosis not present

## 2023-09-08 NOTE — Assessment & Plan Note (Signed)
Had been out of medication, later noted to be pregnant. Has follow-up with OBGYN.

## 2023-09-08 NOTE — Progress Notes (Signed)
     Primary Care / Sports Medicine Office Visit  Patient Information:  Patient ID: Caroline Mendoza, female DOB: 06/22/1991 Age: 32 y.o. MRN: [id number]   Caroline Mendoza is a pleasant 32 y.o. female presenting with the following:  Chief Complaint  Patient presents with   Weight Check    Vitals:   08/18/23 1545  BP: 126/68  Pulse: 96  SpO2: 99%   Vitals:   08/18/23 1545  Weight: 209 lb (94.8 kg)  Height: 5\' 5"  (1.651 m)   Body mass index is 34.78 kg/m.  No results found.   Independent interpretation of notes and tests performed by another provider:   None  Procedures performed:   None  Pertinent History, Exam, Impression, and Recommendations:   Problem List Items Addressed This Visit       Nervous and Auditory   Non-restorative sleep - Primary    Lognstanding issue, concern for OSA, interested in evaluation      Relevant Orders   Ambulatory referral to Pulmonology     Other   Encounter for weight management    Had been out of medication, later noted to be pregnant. Has follow-up with OBGYN.        Orders & Medications Medications: No orders of the defined types were placed in this encounter.  Orders Placed This Encounter  Procedures   Ambulatory referral to Pulmonology     No follow-ups on file.     Jerrol Banana, MD, [facility]   Primary Care Sports Medicine Primary Care and Sports Medicine at [facility]

## 2023-09-08 NOTE — Assessment & Plan Note (Signed)
Lognstanding issue, concern for OSA, interested in evaluation

## 2023-09-18 ENCOUNTER — Encounter: Payer: Self-pay | Admitting: Internal Medicine

## 2023-09-28 DIAGNOSIS — N760 Acute vaginitis: Secondary | ICD-10-CM | POA: Diagnosis not present

## 2023-09-28 DIAGNOSIS — R300 Dysuria: Secondary | ICD-10-CM | POA: Diagnosis not present

## 2023-09-29 DIAGNOSIS — O26851 Spotting complicating pregnancy, first trimester: Secondary | ICD-10-CM | POA: Diagnosis not present

## 2023-09-29 DIAGNOSIS — O3680X0 Pregnancy with inconclusive fetal viability, not applicable or unspecified: Secondary | ICD-10-CM | POA: Diagnosis not present

## 2023-10-03 DIAGNOSIS — O0993 Supervision of high risk pregnancy, unspecified, third trimester: Secondary | ICD-10-CM

## 2023-10-06 DIAGNOSIS — Z3481 Encounter for supervision of other normal pregnancy, first trimester: Secondary | ICD-10-CM | POA: Diagnosis not present

## 2023-10-06 DIAGNOSIS — O99211 Obesity complicating pregnancy, first trimester: Secondary | ICD-10-CM | POA: Diagnosis not present

## 2023-10-06 DIAGNOSIS — Z124 Encounter for screening for malignant neoplasm of cervix: Secondary | ICD-10-CM | POA: Diagnosis not present

## 2023-10-06 LAB — OB RESULTS CONSOLE VARICELLA ZOSTER ANTIBODY, IGG: Varicella: NON-IMMUNE/NOT IMMUNE

## 2023-10-06 LAB — OB RESULTS CONSOLE HEPATITIS B SURFACE ANTIGEN: Hepatitis B Surface Ag: NEGATIVE

## 2023-10-06 LAB — OB RESULTS CONSOLE RUBELLA ANTIBODY, IGM: Rubella: IMMUNE

## 2023-12-13 NOTE — L&D Delivery Note (Signed)
 Delivery Note  Galylea Vega is a [id number] at [redacted]w[redacted]d with an LMP of 07/16/23, consistent with US  at [redacted]w[redacted]d.   First Stage: Labor onset: 2130 Augmentation: oxytocin  Analgesia Darcie Easterly intrapartum: Epidural Date/time: 04/16/24 @ 2130  and Color: clear GBS: Negative/-- (04/16 0000)  IP Antibiotics: abx: none  Second Stage: Complete dilation at 1515 Onset of pushing at 1540 FHR second stage Baseline: 145 bpm decels with pushing   Deaunna presented to L&D for SROM She was 3.5/50/-2. She progressed  to C/C/+2 with a Spontaneous urge to push.  She pushed  effectively over approximately 4 minutes for a spontaneous vaginal birth. Delivery of a viable baby boy on 04/17/2024 . by CNM. Delivery of fetal head in position: Occiput,, Anterior position with restitution to position: Left,, Occiput,, Transverse loose nuchal cord;  Anterior then posterior shoulders delivered easily with gentle downward traction. Baby placed on mom's chest, and attended to by baby RN. Cord double clamped after cessation of pulsation, cut by FOB    Third Stage: Oxytocin  bolus started after delivery of infant for hemorrhage prophylaxis  Placenta delivered [facility] intact with 3 VC @ 1549 Placenta disposition: To Pathology: No  Uterine tone firm / exam; vaginal bleeding: moderate  Laceration: 2nd degree and vaginal laceration identified  Anesthesia for repair: procedures; anesthesia: epidural Repair suture type: 2.0 vicryl Est. Blood Loss (mL): 350  Complications:Diagnoses; OB-GYN delivery complications: none  Mom to postpartum.  Baby to Couplet care / Skin to Skin.  Newborn: Baby Boy "Audry Leavell" Apgar 8.9  Feeding planned: breast feeding  ---------- Arzella Laurence, CNM Certified Nurse Midwife [facility]  Clinic OB/GYN [facility]

## 2024-01-24 LAB — OB RESULTS CONSOLE HIV ANTIBODY (ROUTINE TESTING): HIV: NONREACTIVE

## 2024-02-01 ENCOUNTER — Emergency Department: Payer: BC Managed Care – PPO

## 2024-02-01 ENCOUNTER — Other Ambulatory Visit: Payer: Self-pay

## 2024-02-01 ENCOUNTER — Emergency Department
Admission: EM | Admit: 2024-02-01 | Discharge: 2024-02-01 | Disposition: A | Discharge status: Home / Self Care | Payer: BC Managed Care – PPO | Attending: Emergency Medicine | Admitting: Emergency Medicine

## 2024-02-01 DIAGNOSIS — S60221A Contusion of right hand, initial encounter: Secondary | ICD-10-CM | POA: Insufficient documentation

## 2024-02-01 DIAGNOSIS — W108XXA Fall (on) (from) other stairs and steps, initial encounter: Secondary | ICD-10-CM | POA: Insufficient documentation

## 2024-02-01 DIAGNOSIS — S39012A Strain of muscle, fascia and tendon of lower back, initial encounter: Secondary | ICD-10-CM | POA: Diagnosis not present

## 2024-02-01 DIAGNOSIS — O9A213 Injury, poisoning and certain other consequences of external causes complicating pregnancy, third trimester: Secondary | ICD-10-CM | POA: Diagnosis not present

## 2024-02-01 DIAGNOSIS — W19XXXA Unspecified fall, initial encounter: Secondary | ICD-10-CM

## 2024-02-01 DIAGNOSIS — Z3A30 30 weeks gestation of pregnancy: Secondary | ICD-10-CM | POA: Insufficient documentation

## 2024-02-01 DIAGNOSIS — S60222A Contusion of left hand, initial encounter: Secondary | ICD-10-CM | POA: Diagnosis not present

## 2024-02-01 DIAGNOSIS — T148XXA Other injury of unspecified body region, initial encounter: Secondary | ICD-10-CM

## 2024-02-01 DIAGNOSIS — O26893 Other specified pregnancy related conditions, third trimester: Secondary | ICD-10-CM | POA: Diagnosis present

## 2024-02-01 MED ORDER — ACETAMINOPHEN 500 MG PO TABS: 500.0000 mg | ORAL_TABLET | Freq: Four times a day (QID) | ORAL | 0 refills | Status: DC | PRN

## 2024-02-01 MED ORDER — ACETAMINOPHEN 325 MG PO TABS
650.0000 mg | ORAL_TABLET | Freq: Once | ORAL | Status: AC
  Administered 2024-02-01: 650 mg via ORAL
  Filled 2024-02-01: qty 2

## 2024-02-01 NOTE — ED Triage Notes (Signed)
Patient states she fell down 4 wooden steps and landed on her back; complaining of pain to mid to low back and bilateral hips. Patient reports she is [redacted] weeks pregnant.

## 2024-02-01 NOTE — ED Provider Notes (Signed)
[facility] Provider Note    Event Date/Time   First MD Initiated Contact with Patient 02/01/24 1658     (approximate)   History   Fall   HPI  Caroline Mendoza is a 33 y.o. female who presents today after falling down wooden stairs.  Patient states having pain to the sacral area.  Patient states pregnancy of 28 weeks.  Patient denies vaginal bleeding, contractions.  Patient states feeling fetal movements.     Physical Exam   Triage Vital Signs: ED Triage Vitals  Encounter Vitals Group     BP 02/01/24 1521 96/76     Systolic BP Percentile --      Diastolic BP Percentile --      Pulse Rate 02/01/24 1521 100     Resp 02/01/24 1521 18     Temp 02/01/24 1521 97.8 F (36.6 C)     Temp src --      SpO2 02/01/24 1521 98 %     Weight --      Height --      Head Circumference --      Peak Flow --      Pain Score 02/01/24 1522 8     Pain Loc --      Pain Education --      Exclude from Growth Chart --     Most recent vital signs: Vitals:   02/01/24 1521  BP: 96/76  Pulse: 100  Resp: 18  Temp: 97.8 F (36.6 C)  SpO2: 98%     Constitutional: Alert, mild distress Eyes: Conjunctivae are normal.  Head: Atraumatic. Nose: No congestion/rhinnorhea. Mouth/Throat: Mucous membranes are moist.   Neck: Painless ROM.  Cardiovascular:   Good peripheral circulation. Respiratory: Normal respiratory effort.  No retractions.  Gastrointestinal: Soft and nontender.  Abdomen nontender to palpation Musculoskeletal:  no deformity Lumbar spine: Tender to palpation and left paraspinal area, edema.  Tender to palpation until sacral area.  Ecchymosis in both hands Neurologic:  MAE spontaneously. No gross focal neurologic deficits are appreciated.  Skin:  Skin is warm, dry and intact. No rash noted. Psychiatric: Mood and affect are normal. Speech and behavior are normal.    ED Results / Procedures / Treatments   Labs (all labs ordered are  listed, but only abnormal results are displayed) Labs Reviewed - No data to display   EKG See physician read    RADIOLOGY I independently reviewed and interpreted imaging and agree with radiologists findings.      PROCEDURES:  Critical Care performed:   Procedures   MEDICATIONS ORDERED IN ED: Medications  acetaminophen (TYLENOL) tablet 650 mg (650 mg Oral Given 02/01/24 1843)     IMPRESSION / MDM / ASSESSMENT AND PLAN / ED COURSE  I reviewed the triage vital signs and the nursing notes.  Differential diagnosis includes, but is not limited to, 28 weeks pregnancy, lumbar fracture,  muscular strain,   Patient's presentation is most consistent with acute complicated illness / injury requiring diagnostic workup.   Patient's diagnosis is consistent with fall, pregnancy of 30 weeks, lumbar and sacral muscle strain. I independently reviewed and interpreted imaging and agree with radiologists findings. Labs are  reassuring. I did review the patient's allergies and medications. Patient will be discharged home with prescriptions for Tylenol 500 mg. Patient is to follow up with OB/GYN as needed or otherwise directed. Patient is given ED precautions to return to the ED for any worsening or new symptoms. Discussed  plan of care with patient, answered all of patient's questions, Patient agreeable to plan of care. Advised patient to take medications according to the instructions on the label. Discussed possible side effects of new medications. Patient verbalized understanding. Clinical Course as of 02/01/24 2018  Thu Feb 01, 2024  2015 US OB Limited  Single live intrauterine pregnancy as above, estimated age 23 weeks and 1 day. No evidence of complication.  This exam is performed on an emergent basis and does not comprehensively evaluate fetal size, dating, or anatomy; follow-up complete OB US should be considered if further fetal assessment is warranted.   [AE]    Clinical Course  User Index [AE] Gladys Damme, PA-C     FINAL CLINICAL IMPRESSION(S) / ED DIAGNOSES   Final diagnoses:  Fall, initial encounter  [redacted] weeks gestation of pregnancy  Muscle strain     Rx / DC Orders   ED Discharge Orders          Ordered    acetaminophen (TYLENOL) 500 MG tablet  Every 6 hours PRN        02/01/24 2017             Note:  This document was prepared using Dragon voice recognition software and may include unintentional dictation errors.   Gladys Damme, PA-C 02/01/24 2018    Minna Antis, MD 02/01/24 [phone]

## 2024-02-01 NOTE — ED Notes (Signed)
FHT's 144

## 2024-02-01 NOTE — Discharge Instructions (Signed)
You have been diagnosed with lumbar and sacral muscle strain, pregnancy of 30 weeks.  Please take Tylenol every 6 hours for back pain.  Please use cold pack for 20 minutes every 3 hours.  Please walk every 2 hours.  Please come back or go to your OB/GYN if you have new symptoms or symptoms worsen.

## 2024-02-01 NOTE — ED Provider Triage Note (Signed)
Emergency Medicine Provider Triage Evaluation Note  Caroline Mendoza , a 33 y.o. female  was evaluated in triage.  Pt complains of fall that happened about 2 hours ago. Patient slipped on the stairs and landed on her back. She is about [redacted] weeks pregnant, has felt her baby move since she fell.  Review of Systems  Positive: Back pain Negative:  Numbness, tingling in legs  Physical Exam  BP 96/76   Pulse 100   Temp 97.8 F (36.6 C)   Resp 18   SpO2 98%  Gen:   Awake, no distress   Resp:  Normal effort  MSK:   Moves extremities without difficulty  Other:    Medical Decision Making  Medically screening exam initiated at 3:22 PM.  Appropriate orders placed.  Caroline Mendoza was informed that the remainder of the evaluation will be completed by another provider, this initial triage assessment does not replace that evaluation, and the importance of remaining in the ED until their evaluation is complete.     Cameron Ali, PA-C 02/01/24 1523

## 2024-02-01 NOTE — ED Notes (Signed)
 Pt verbalizes understanding of discharge instructions. Opportunity for questioning and answers were provided. Pt discharged from ED to home with family.

## 2024-03-18 ENCOUNTER — Other Ambulatory Visit: Payer: Self-pay

## 2024-03-18 ENCOUNTER — Observation Stay
Admission: EM | Admit: 2024-03-18 | Discharge: 2024-03-18 | Disposition: A | Discharge status: Home / Self Care | Attending: Obstetrics and Gynecology | Admitting: Obstetrics and Gynecology

## 2024-03-18 DIAGNOSIS — O26893 Other specified pregnancy related conditions, third trimester: Secondary | ICD-10-CM | POA: Diagnosis present

## 2024-03-18 DIAGNOSIS — Z3A35 35 weeks gestation of pregnancy: Secondary | ICD-10-CM | POA: Diagnosis not present

## 2024-03-18 DIAGNOSIS — O479 False labor, unspecified: Principal | ICD-10-CM | POA: Diagnosis present

## 2024-03-18 DIAGNOSIS — O2343 Unspecified infection of urinary tract in pregnancy, third trimester: Secondary | ICD-10-CM | POA: Diagnosis not present

## 2024-03-18 DIAGNOSIS — R319 Hematuria, unspecified: Secondary | ICD-10-CM | POA: Diagnosis not present

## 2024-03-18 LAB — URINALYSIS, ROUTINE W REFLEX MICROSCOPIC
Bilirubin Urine: NEGATIVE
Glucose, UA: NEGATIVE mg/dL
Ketones, ur: NEGATIVE mg/dL
Nitrite: NEGATIVE
Protein, ur: 30 mg/dL — AB
RBC / HPF: >50 RBC/hpf (ref 0–5)
Specific Gravity, Urine: 1.021 (ref 1.005–1.030)
pH: 5 (ref 5.0–8.0)

## 2024-03-18 LAB — WET PREP, GENITAL
Clue Cells Wet Prep HPF POC: NONE SEEN
Sperm: NONE SEEN
Trich, Wet Prep: NONE SEEN
WBC, Wet Prep HPF POC: <10 (ref <10)
Yeast Wet Prep HPF POC: NONE SEEN

## 2024-03-18 MED ORDER — CEPHALEXIN 500 MG PO CAPS
500.0000 mg | ORAL_CAPSULE | Freq: Two times a day (BID) | ORAL | Status: DC
  Administered 2024-03-18: 500 mg via ORAL
  Filled 2024-03-18: qty 1

## 2024-03-18 MED ORDER — CEPHALEXIN 500 MG PO CAPS: 500.0000 mg | ORAL_CAPSULE | Freq: Two times a day (BID) | ORAL | 0 refills | Status: AC

## 2024-03-18 MED ORDER — ACETAMINOPHEN 325 MG PO TABS: 650.0000 mg | ORAL_TABLET | ORAL | Status: DC | PRN

## 2024-03-18 MED ORDER — CALCIUM CARBONATE ANTACID 500 MG PO CHEW: 2.0000 | CHEWABLE_TABLET | ORAL | Status: DC | PRN

## 2024-03-18 NOTE — Discharge Summary (Signed)
 Caroline Mendoza is a 33 y.o. female. She is at [redacted]w[redacted]d gestation. Patient's last menstrual period was 03/21/2023 (exact date). 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 Has hx of UTIs. Denies burning or pain with urination. Denies flank pain or fevers. Endorses good fetal movement. Has occasional uterine cramping but denies contractions.  S: Resting comfortably. no CTX, no VB.no LOF,  Active fetal movement.  Denies: HA, visual changes, SOB, or RUQ/epigastric pain  Maternal Medical History:   Past Medical History:  Diagnosis Date   Hemorrhoids     Past Surgical History:  Procedure Laterality Date   COLONOSCOPY WITH PROPOFOL N/A 09/17/2021   Procedure: COLONOSCOPY WITH PROPOFOL;  Surgeon: Pasty Spillers, MD;  Location: [facility] ENDOSCOPY;  Service: Endoscopy;  Laterality: N/A;   WISDOM TOOTH EXTRACTION      No Known Allergies  Prior to Admission medications   Medication Sig Start Date End Date Taking? Authorizing Provider  ferrous sulfate 325 (65 FE) MG EC tablet Take 325 mg by mouth 3 (three) times daily with meals.   Yes [provider]  Prenatal Vit-Fe Fumarate-FA (PRENATAL MULTIVITAMIN) TABS tablet Take 1 tablet by mouth daily at 12 noon.   Yes [provider]  acetaminophen (TYLENOL) 500 MG tablet Take 1 tablet (500 mg total) by mouth every 6 (six) hours as needed. 02/01/24   Gladys Damme, PA-C  cephALEXin (KEFLEX) 500 MG capsule Take 1 capsule (500 mg total) by mouth 2 (two) times daily for 7 days. 03/18/24 03/25/24  Janyce Llanos, CNM  ondansetron (ZOFRAN-ODT) 4 MG disintegrating tablet Allow 1-2 tablets to dissolve in your mouth  every 8 hours as needed for nausea/vomiting 04/05/23   Loleta Rose, MD  phentermine 15 MG capsule Take 1 capsule (15 mg total) by mouth every morning. 05/19/23   Jerrol Banana, MD  topiramate (TOPAMAX) 50 MG tablet Take 1 tablet (50 mg total) by mouth daily. 05/19/23   Jerrol Banana, MD  triamcinolone ointment (KENALOG) 0.1 % Apply 1 Application topically 2 (two) times daily. To affected areas Patient not taking: Reported on 03/18/2024 11/11/22   Jerrol Banana, MD    Social History: She  reports that she has never smoked. She has never used smokeless tobacco. She reports that she does not currently use alcohol. She reports that she does not use drugs.  Family History: family history includes Healthy in her brother, mother, sister, and sister; Heart attack in her father; Stroke in her father.  no history of gyn cancers  Review of Systems: A full review of systems was performed and negative except as noted in the HPI.     O:  BP 99/67 (BP Location: Left Arm)   Pulse 95   Temp 98.2 F (36.8 C) (Oral)   Resp 18   Ht 5\' 4"  (1.626 m)   Wt 117.9 kg   LMP 03/21/2023 (Exact Date)   BMI 44.63 kg/m  Results for orders placed or performed during the hospital encounter of 03/18/24 (from the past 48 hours)  Wet prep, genital   Collection Time: 03/18/24 11:47 AM   Specimen: Urine, Clean Catch  Result Value Ref Range   Yeast Wet Prep HPF  POC NONE SEEN NONE SEEN   Trich, Wet Prep NONE SEEN NONE SEEN   Clue Cells Wet Prep HPF POC NONE SEEN NONE SEEN   WBC, Wet Prep HPF POC <10 <10   Sperm NONE SEEN   Urinalysis, Routine w reflex microscopic -Urine, Clean Catch   Collection Time: 03/18/24 11:47 AM  Result Value Ref Range   Color, Urine YELLOW (A) YELLOW   APPearance CLOUDY (A) CLEAR   Specific Gravity, Urine 1.021 1.005 - 1.030   pH 5.0 5.0 - 8.0   Glucose, UA NEGATIVE NEGATIVE mg/dL   Hgb urine dipstick LARGE (A) NEGATIVE   Bilirubin Urine NEGATIVE NEGATIVE   Ketones, ur NEGATIVE  NEGATIVE mg/dL   Protein, ur 30 (A) NEGATIVE mg/dL   Nitrite NEGATIVE NEGATIVE   Leukocytes,Ua TRACE (A) NEGATIVE   RBC / HPF >50 0 - 5 RBC/hpf   WBC, UA 21-50 0 - 5 WBC/hpf   Bacteria, UA RARE (A) NONE SEEN   Squamous Epithelial / HPF 21-50 0 - 5 /HPF   Mucus PRESENT      Constitutional: NAD, AAOx3  HE/ENT: extraocular movements grossly intact, moist mucous membranes CV: RRR PULM: nl respiratory effort, CTABL     Abd: gravid, non-tender, non-distended, soft      Ext: Non-tender, Nonedematous   Psych: mood appropriate, speech normal Pelvic: 1/thick/-3, no blood present  Pelvic exam: normal external genitalia, vulva, vagina, cervix, uterus and adnexa.  Fetal  monitoring: Cat 1 Appropriate for GA Baseline: 135bpm Variability: moderate Accelerations: present x >2 Decelerations absent Contractions: rare, irritability intermittently present Time  A/P: 33 y.o. [redacted]w[redacted]d here for antenatal surveillance for pink-tinged urine  Principle Diagnosis:  UTI in pregnancy  UTI: present, treat with Keflex 500mg  BID. Return with worsening symptoms. Labor: not present.  Fetal Wellbeing: Reassuring Cat 1 tracing. Reactive NST  D/c home stable, precautions reviewed, follow-up as scheduled.    Janyce Llanos, CNM 03/18/2024 12:56 PM

## 2024-03-18 NOTE — OB Triage Note (Signed)
 Pt is a 33yo G3P1001, 35w 1d. She arrived to the unit with complaints of  pink urine and abdominal cramping. She states that she first noticed her urine was pink yesterday morning and the abdominal cramping started yesterday around 1700. She denies vaginal bleeding, reports positive fetal movement, and reports no contractions at this time . VS stable, monitors applied and assessing.   Initial FHT 160 at 1135.

## 2024-03-18 NOTE — Progress Notes (Signed)
 Discharge instructions provided to pt. Pt understands to pick up her Keflex prescription for her UTI. Vaginal bleeding and discharge, contractions, and fetal movement reviewed by RN. Follow-up care reviewed. Pt discharged home by self ambulatory.

## 2024-03-27 ENCOUNTER — Other Ambulatory Visit: Payer: Self-pay

## 2024-03-27 ENCOUNTER — Observation Stay
Admission: EM | Admit: 2024-03-27 | Discharge: 2024-03-27 | Disposition: A | Discharge status: Home / Self Care | Attending: Obstetrics and Gynecology | Admitting: Obstetrics and Gynecology

## 2024-03-27 ENCOUNTER — Encounter: Payer: Self-pay | Admitting: Obstetrics and Gynecology

## 2024-03-27 DIAGNOSIS — O368330 Maternal care for abnormalities of the fetal heart rate or rhythm, third trimester, not applicable or unspecified: Principal | ICD-10-CM | POA: Insufficient documentation

## 2024-03-27 DIAGNOSIS — Z3A36 36 weeks gestation of pregnancy: Secondary | ICD-10-CM | POA: Insufficient documentation

## 2024-03-27 DIAGNOSIS — O368390 Maternal care for abnormalities of the fetal heart rate or rhythm, unspecified trimester, not applicable or unspecified: Principal | ICD-10-CM | POA: Diagnosis present

## 2024-03-27 LAB — OB RESULTS CONSOLE GC/CHLAMYDIA
Chlamydia: NEGATIVE
Neisseria Gonorrhea: NEGATIVE

## 2024-03-27 LAB — OB RESULTS CONSOLE GBS: GBS: NEGATIVE

## 2024-03-27 NOTE — OB Triage Note (Signed)
 Patient is a G3P1011 at [redacted]w[redacted]d who was sent over from the office for a non-reactive NST. Reports +fetal movement, denies contractions, vaginal bleeding and leakage of fluid. External monitors applied and assessing. Initial FHT 150. Vital signs WDL.

## 2024-03-27 NOTE — OB Triage Note (Signed)
Discharge instructions, labor precautions, and follow-up care reviewed with patient. All questions answered. Patient verbalized understanding. Discharged ambulatory off unit.   

## 2024-03-27 NOTE — Discharge Summary (Signed)
 Patient ID: Caroline Mendoza MRN: [id number] DOB/AGE: 02/19/1991 33 y.o.  Admit date: 03/27/2024 Discharge date: 03/27/2024  Admission Diagnoses: 33 yo G3P1011 at 35 w3d who was sent over from the office for a nonreactive NST. Reports +fetal movement, denies contractions, vaginal bleeding and leakage of fluid.   Discharge Diagnoses: Reactive NST  Factors complicating pregnancy: Obesity BMI: 37---> BMI 45 on 03/14/24 History of PPH requiring blood transfusion from second degree laceration. Anemia  Fetal macrosomia  Prenatal Procedures: NST  Consults: None  Significant Diagnostic Studies:  No results found for this or any previous visit (from the past week).  Treatments: None  Hospital Course:  This is a 33 y.o. G3P1011 with IUP at [redacted]w[redacted]d admitted for nonreactive. No leaking of fluid and no bleeding. She was observed for 1 hour, NST was reactive,and she had no signs/symptoms of preterm labor or other maternal-fetal concerns. She was deemed stable for discharge to home with outpatient follow up.   Discharge Physical Exam:  BP 96/73   Pulse 89   Temp 98.1 F (36.7 C) (Oral)   Resp 15   Ht 5\' 4"  (1.626 m)   Wt 121.1 kg   LMP 03/21/2023 (Exact Date)   BMI 45.83 kg/m   General: NAD CV: RRR Pulm: nl effort ABD: s/nd/nt, gravid DVT Evaluation: LE non-ttp, no evidence of DVT on exam.  NST: FHR baseline: 135 bpm Variability: moderate Accelerations: yes Decelerations: none Category/reactivity: reactive  TOCO: quiet SVE: deferred      Discharge Condition: Stable  Disposition:  Discharge disposition: 01-Home or Self Care        Allergies as of 03/27/2024   No Known Allergies      Medication List     STOP taking these medications    triamcinolone ointment 0.1 % Commonly known as: KENALOG       TAKE these medications    acetaminophen 500 MG tablet Commonly known as: TYLENOL Take 1 tablet (500 mg total) by mouth every 6 (six) hours as  needed.   ferrous sulfate 325 (65 FE) MG EC tablet Take 325 mg by mouth 3 (three) times daily with meals.   ondansetron 4 MG disintegrating tablet Commonly known as: ZOFRAN-ODT Allow 1-2 tablets to dissolve in your mouth every 8 hours as needed for nausea/vomiting   phentermine 15 MG capsule Take 1 capsule (15 mg total) by mouth every morning.   prenatal multivitamin Tabs tablet Take 1 tablet by mouth daily at 12 noon.   topiramate 50 MG tablet Commonly known as: Topamax Take 1 tablet (50 mg total) by mouth daily.         SignedCollin Deal, CNM 03/27/2024 2:29 PM

## 2024-04-07 ENCOUNTER — Other Ambulatory Visit: Payer: Self-pay

## 2024-04-07 ENCOUNTER — Observation Stay
Admission: EM | Admit: 2024-04-07 | Discharge: 2024-04-07 | Disposition: A | Discharge status: Home / Self Care | Attending: Obstetrics and Gynecology | Admitting: Obstetrics and Gynecology

## 2024-04-07 ENCOUNTER — Other Ambulatory Visit: Payer: Self-pay | Admitting: Certified Nurse Midwife

## 2024-04-07 ENCOUNTER — Encounter: Payer: Self-pay | Admitting: Obstetrics and Gynecology

## 2024-04-07 DIAGNOSIS — Z3A38 38 weeks gestation of pregnancy: Secondary | ICD-10-CM | POA: Diagnosis not present

## 2024-04-07 DIAGNOSIS — K649 Unspecified hemorrhoids: Secondary | ICD-10-CM

## 2024-04-07 DIAGNOSIS — Z79899 Other long term (current) drug therapy: Secondary | ICD-10-CM | POA: Insufficient documentation

## 2024-04-07 DIAGNOSIS — O2243 Hemorrhoids in pregnancy, third trimester: Secondary | ICD-10-CM | POA: Diagnosis present

## 2024-04-07 DIAGNOSIS — Z3490 Encounter for supervision of normal pregnancy, unspecified, unspecified trimester: Principal | ICD-10-CM

## 2024-04-07 MED ORDER — WITCH HAZEL-GLYCERIN EX PADS: 1.0000 | MEDICATED_PAD | CUTANEOUS | 0 refills | Status: DC | PRN

## 2024-04-07 MED ORDER — WITCH HAZEL-GLYCERIN EX PADS
MEDICATED_PAD | CUTANEOUS | Status: DC | PRN
  Administered 2024-04-07: 1 via TOPICAL
  Filled 2024-04-07: qty 100

## 2024-04-07 MED ORDER — HYDROCORTISONE ACETATE 25 MG RE SUPP: 25.0000 mg | Freq: Two times a day (BID) | RECTAL | 1 refills | Status: DC

## 2024-04-07 MED ORDER — ACETAMINOPHEN 500 MG PO TABS
1000.0000 mg | ORAL_TABLET | Freq: Four times a day (QID) | ORAL | Status: DC
  Administered 2024-04-07: 1000 mg via ORAL
  Filled 2024-04-07: qty 2

## 2024-04-07 MED ORDER — DOCUSATE SODIUM 100 MG PO CAPS
100.0000 mg | ORAL_CAPSULE | Freq: Every day | ORAL | Status: DC
  Administered 2024-04-07: 100 mg via ORAL
  Filled 2024-04-07: qty 1

## 2024-04-07 MED ORDER — HYDROCORTISONE ACETATE 25 MG RE SUPP
25.0000 mg | Freq: Two times a day (BID) | RECTAL | Status: DC
  Administered 2024-04-07: 25 mg via RECTAL
  Filled 2024-04-07: qty 1

## 2024-04-07 MED ORDER — DOCUSATE SODIUM 100 MG PO CAPS: 100.0000 mg | ORAL_CAPSULE | Freq: Two times a day (BID) | ORAL | 0 refills | Status: DC

## 2024-04-07 NOTE — Discharge Summary (Signed)
 Caroline Mendoza is a 33 y.o. female. She is at [redacted]w[redacted]d gestation. Patient's last menstrual period was 03/21/2023 (exact date). Estimated Date of Delivery: 04/21/24  Prenatal care site: [facility]   Current pregnancy complicated by:  Obesity  History of PPH requiring blood transfusion from second degree laceration. Anemia  Fetal macrosomia  Chief complaint: hemorrhoids  She reports hemorrhoids that are getting progressively more painful. She tried Preparation H without relief. The hemorrhoids are causing severe pain so she is unable to sleep. She denies bleeding or contractions.  S: Resting comfortably. no CTX, no VB.no LOF,  Active fetal movement.  Denies: HA, visual changes, SOB, or RUQ/epigastric pain  Maternal Medical History:   Past Medical History:  Diagnosis Date   Hemorrhoids     Past Surgical History:  Procedure Laterality Date   COLONOSCOPY WITH PROPOFOL  N/A 09/17/2021   Procedure: COLONOSCOPY WITH PROPOFOL ;  Surgeon: Irby Mannan, MD;  Location: [facility] ENDOSCOPY;  Service: Endoscopy;  Laterality: N/A;   WISDOM TOOTH EXTRACTION      No Known Allergies  Prior to Admission medications   Medication Sig Start Date End Date Taking? Authorizing Provider  acetaminophen  (TYLENOL ) 500 MG tablet Take 1 tablet (500 mg total) by mouth every 6 (six) hours as needed. 02/01/24  Yes Awilda Lennox, PA-C  ferrous sulfate  325 (65 FE) MG EC tablet Take 325 mg by mouth 3 (three) times daily with meals.   Yes [provider]  Prenatal Vit-Fe Fumarate-FA (PRENATAL MULTIVITAMIN) TABS tablet Take 1 tablet by mouth daily at 12 noon.   Yes [provider]  docusate sodium  (COLACE) 100 MG capsule Take 1 capsule (100 mg total) by mouth 2 (two) times daily. 04/07/24 04/07/25  Auston Left, CNM  hydrocortisone  (ANUSOL -HC) 25 MG suppository Place 1 suppository (25 mg total) rectally 2 (two) times daily. 04/07/24   Auston Left, CNM   ondansetron  (ZOFRAN -ODT) 4 MG disintegrating tablet Allow 1-2 tablets to dissolve in your mouth every 8 hours as needed for nausea/vomiting Patient not taking: Reported on 04/07/2024 04/05/23   Lynnda Sas, MD  phentermine  15 MG capsule Take 1 capsule (15 mg total) by mouth every morning. Patient not taking: Reported on 04/07/2024 05/19/23   Ma Saupe, MD  topiramate  (TOPAMAX ) 50 MG tablet Take 1 tablet (50 mg total) by mouth daily. Patient not taking: Reported on 04/07/2024 05/19/23   Ma Saupe, MD  witch hazel-glycerin  (TUCKS) pad Apply 1 Application topically as needed for itching. 04/07/24   Auston Left, CNM    Social History: She  reports that she has never smoked. She has never used smokeless tobacco. She reports that she does not currently use alcohol. She reports that she does not use drugs.  Family History: family history includes Healthy in her brother, mother, sister, and sister; Heart attack in her father; Stroke in her father.  no history of gyn cancers  Review of Systems: A full review of systems was performed and negative except as noted in the HPI.     O:  BP 112/78 (BP Location: Left Arm)   Pulse 88   Temp 97.8 F (36.6 C) (Oral)   Resp 20   Ht 5\' 4"  (1.626 m)   Wt 121.6 kg   LMP 03/21/2023 (Exact Date)   BMI 46.00 kg/m  No results found for this or any previous visit (from the past 48 hours).   Constitutional: NAD, AAOx3  HE/ENT: extraocular movements grossly intact, moist mucous membranes CV:  RRR PULM: nl respiratory effort, CTABL     Abd: gravid, non-tender, non-distended, soft      Ext: Non-tender, Nonedematous   Psych: mood appropriate, speech normal Pelvic: deferred  Pelvic exam: hemorrhoids noted to be enlarged but pink and soft, no evidence of thrombosis  Fetal  monitoring: Cat 1 Appropriate for GA Baseline: 130bpm Variability: moderate Accelerations: present x >2 Decelerations absent Contractions: irritability and occasional  contractions q103min present Time 2 hours  A/P: 33 y.o. [redacted]w[redacted]d here for antenatal surveillance for hemorrhoids  Principle Diagnosis:  Hemorrhoids in pregnancy  Hemorrhoids: pain improved with Anusol  suppository, Tylenol  1000mg , Tucks pads, and an ice pack. Colace for stool softener. Anusol  suppository and Tucks pads and Colace sent to her pharmacy.  Labor: not present. She is not feeling consistent painful contractions. Fetal Wellbeing: Reassuring Cat 1 tracing. D/c home stable, precautions reviewed, follow-up as scheduled.    Auston Left, CNM 04/07/2024

## 2024-04-07 NOTE — OB Triage Note (Signed)
 Patient is a G3P1, [redacted]w[redacted]d that presents with complaint of hemorrhoid pain. Patient states that she has a history of hemorrhoids. Pain began yesterday, patient states that she has tried tylenol , preperation H, and warm baths but has no relief. Patient states that pain is a 10/10 on a 0-10 pain scale. Patient denies bleeding and LOF. Patient reports intermittent contractions, rates her pain a 6 on a 0-10 pain scale for ctx. +FM. VSS. Monitors applied and assessing.

## 2024-04-07 NOTE — Progress Notes (Signed)
 Hemorrhoids are pink in appearance and soft to touch. Several covering the rectum.

## 2024-04-07 NOTE — OB Triage Note (Signed)
 Discharge instructions, labor precautions, and follow-up care reviewed with patient and significant other. All questions answered. Patient verbalized understanding. Discharged ambulatory off unit.

## 2024-04-12 DIAGNOSIS — O2603 Excessive weight gain in pregnancy, third trimester: Secondary | ICD-10-CM | POA: Diagnosis present

## 2024-04-12 DIAGNOSIS — O3663X1 Maternal care for excessive fetal growth, third trimester, fetus 1: Secondary | ICD-10-CM | POA: Diagnosis present

## 2024-04-16 ENCOUNTER — Inpatient Hospital Stay
Admission: EM | Admit: 2024-04-16 | Discharge: 2024-04-18 | DRG: 807 | Disposition: A | Discharge status: Home / Self Care | Attending: Obstetrics | Admitting: Obstetrics

## 2024-04-16 ENCOUNTER — Other Ambulatory Visit: Payer: Self-pay

## 2024-04-16 ENCOUNTER — Encounter: Payer: Self-pay | Admitting: Obstetrics and Gynecology

## 2024-04-16 DIAGNOSIS — E66813 Obesity, class 3: Secondary | ICD-10-CM | POA: Diagnosis present

## 2024-04-16 DIAGNOSIS — Z3A39 39 weeks gestation of pregnancy: Secondary | ICD-10-CM

## 2024-04-16 DIAGNOSIS — Z8759 Personal history of other complications of pregnancy, childbirth and the puerperium: Principal | ICD-10-CM

## 2024-04-16 DIAGNOSIS — O3663X0 Maternal care for excessive fetal growth, third trimester, not applicable or unspecified: Secondary | ICD-10-CM | POA: Diagnosis present

## 2024-04-16 DIAGNOSIS — O2603 Excessive weight gain in pregnancy, third trimester: Secondary | ICD-10-CM | POA: Diagnosis present

## 2024-04-16 DIAGNOSIS — O4292 Full-term premature rupture of membranes, unspecified as to length of time between rupture and onset of labor: Secondary | ICD-10-CM | POA: Diagnosis present

## 2024-04-16 DIAGNOSIS — O99214 Obesity complicating childbirth: Secondary | ICD-10-CM | POA: Diagnosis present

## 2024-04-16 DIAGNOSIS — O4290 Premature rupture of membranes, unspecified as to length of time between rupture and onset of labor, unspecified weeks of gestation: Secondary | ICD-10-CM | POA: Diagnosis present

## 2024-04-16 DIAGNOSIS — Z8249 Family history of ischemic heart disease and other diseases of the circulatory system: Secondary | ICD-10-CM

## 2024-04-16 DIAGNOSIS — O26843 Uterine size-date discrepancy, third trimester: Secondary | ICD-10-CM | POA: Diagnosis present

## 2024-04-16 DIAGNOSIS — O99213 Obesity complicating pregnancy, third trimester: Secondary | ICD-10-CM | POA: Diagnosis present

## 2024-04-16 DIAGNOSIS — O0993 Supervision of high risk pregnancy, unspecified, third trimester: Secondary | ICD-10-CM

## 2024-04-16 DIAGNOSIS — O26893 Other specified pregnancy related conditions, third trimester: Secondary | ICD-10-CM | POA: Diagnosis present

## 2024-04-16 DIAGNOSIS — Z79899 Other long term (current) drug therapy: Secondary | ICD-10-CM | POA: Diagnosis not present

## 2024-04-16 DIAGNOSIS — O3663X1 Maternal care for excessive fetal growth, third trimester, fetus 1: Secondary | ICD-10-CM | POA: Diagnosis present

## 2024-04-16 HISTORY — DX: Personal history of other complications of pregnancy, childbirth and the puerperium: Z87.59

## 2024-04-16 LAB — CBC
HCT: 37.4 % (ref 36.0–46.0)
Hemoglobin: 12.3 g/dL (ref 12.0–15.0)
MCH: 28.1 pg (ref 26.0–34.0)
MCHC: 32.9 g/dL (ref 30.0–36.0)
MCV: 85.6 fL (ref 80.0–100.0)
Platelets: 278 10*3/uL (ref 150–400)
RBC: 4.37 MIL/uL (ref 3.87–5.11)
RDW: 14.9 % (ref 11.5–15.5)
WBC: 9.6 10*3/uL (ref 4.0–10.5)
nRBC: 0 % (ref 0.0–0.2)

## 2024-04-16 MED ORDER — LIDOCAINE HCL (PF) 1 % IJ SOLN
INTRAMUSCULAR | Status: AC
  Filled 2024-04-16: qty 30

## 2024-04-16 MED ORDER — SOD CITRATE-CITRIC ACID 500-334 MG/5ML PO SOLN: 30.0000 mL | ORAL | Status: DC | PRN

## 2024-04-16 MED ORDER — OXYTOCIN 10 UNIT/ML IJ SOLN
INTRAMUSCULAR | Status: AC
  Filled 2024-04-16: qty 2

## 2024-04-16 MED ORDER — LIDOCAINE HCL (PF) 1 % IJ SOLN: 30.0000 mL | INTRAMUSCULAR | Status: DC | PRN

## 2024-04-16 MED ORDER — OXYTOCIN-SODIUM CHLORIDE 30-0.9 UT/500ML-% IV SOLN
INTRAVENOUS | Status: AC
  Filled 2024-04-16: qty 500

## 2024-04-16 MED ORDER — FENTANYL CITRATE (PF) 100 MCG/2ML IJ SOLN: 50.0000 ug | INTRAMUSCULAR | Status: DC | PRN

## 2024-04-16 MED ORDER — MISOPROSTOL 200 MCG PO TABS
ORAL_TABLET | ORAL | Status: AC
  Filled 2024-04-16: qty 4

## 2024-04-16 MED ORDER — SODIUM CHLORIDE 0.9% FLUSH: 3.0000 mL | Freq: Two times a day (BID) | INTRAVENOUS | Status: DC

## 2024-04-16 MED ORDER — ACETAMINOPHEN 500 MG PO TABS: 1000.0000 mg | ORAL_TABLET | Freq: Four times a day (QID) | ORAL | Status: DC | PRN

## 2024-04-16 MED ORDER — OXYTOCIN-SODIUM CHLORIDE 30-0.9 UT/500ML-% IV SOLN
2.5000 [IU]/h | INTRAVENOUS | Status: DC
  Administered 2024-04-17: 2.5 [IU]/h via INTRAVENOUS
  Filled 2024-04-16: qty 500

## 2024-04-16 MED ORDER — ONDANSETRON HCL 4 MG/2ML IJ SOLN: 4.0000 mg | Freq: Four times a day (QID) | INTRAMUSCULAR | Status: DC | PRN

## 2024-04-16 MED ORDER — AMMONIA AROMATIC IN INHA
RESPIRATORY_TRACT | Status: AC
  Filled 2024-04-16: qty 10

## 2024-04-16 MED ORDER — SODIUM CHLORIDE 0.9% FLUSH: 3.0000 mL | INTRAVENOUS | Status: DC | PRN

## 2024-04-16 MED ORDER — SODIUM CHLORIDE 0.9 % IV SOLN: 250.0000 mL | INTRAVENOUS | Status: DC | PRN

## 2024-04-16 MED ORDER — LACTATED RINGERS IV SOLN
500.0000 mL | INTRAVENOUS | Status: DC | PRN
  Administered 2024-04-17: 500 mL via INTRAVENOUS

## 2024-04-16 MED ORDER — LACTATED RINGERS IV SOLN: INTRAVENOUS | Status: DC

## 2024-04-16 MED ORDER — OXYTOCIN BOLUS FROM INFUSION
333.0000 mL | Freq: Once | INTRAVENOUS | Status: AC
  Administered 2024-04-17: 333 mL via INTRAVENOUS

## 2024-04-16 NOTE — H&P (Cosign Needed Addendum)
 OB History & Physical   History of Present Illness:   Chief Complaint: "water broke"  HPI:  Caroline Mendoza is a 33 y.o. G26P1011 female at [redacted]w[redacted]d, Patient's last menstrual period was 07/16/2023 (exact date)., consistent with US  at [redacted]w[redacted]d, with Estimated Date of Delivery: 04/21/24.  She presents to L&D for after her water broke at home.  Her pregnancy is complicated by obesity, LGA, excessive weight gain during pregnancy, and history of PPH .  She denies Vaginal bleeding. Endorses fetal movement as active. She's unsure how often she's contracting but felt more of them after her water broke.   Reports active fetal movement  Contractions:  irregular contractions  LOF/SROM: Clear fluid at 2130 Vaginal bleeding: denies   Factors complicating pregnancy:  Principal Problem:   Delayed delivery after SROM (spontaneous rupture of membranes) Active Problems:   Uterine size-date discrepancy in third trimester   Obesity affecting pregnancy in third trimester   Macrosomia affecting management of mother in third trimester, fetus 1   Excessive weight gain during pregnancy in third trimester   Supervision of high risk pregnancy in third trimester   History of postpartum hemorrhage    Prenatal care site:  [facility]  Patient Active Problem List   Diagnosis Date Noted   History of postpartum hemorrhage 04/16/2024   Delayed delivery after SROM (spontaneous rupture of membranes) 04/16/2024   Macrosomia affecting management of mother in third trimester, fetus 1 04/12/2024   Excessive weight gain during pregnancy in third trimester 04/12/2024   Supervision of high risk pregnancy in third trimester 10/03/2023   Polyp of sigmoid colon    Epigastric pain 08/30/2021   Posterior tibial tendinitis, right 06/07/2021   Obesity affecting pregnancy in third trimester 05/08/2021   Hyperlipidemia 05/06/2021   Uterine size-date discrepancy in third trimester 04/08/2021   Elevated random  blood glucose level 04/08/2021   Severe obesity (BMI >= 40) (HCC) 04/08/2021   Patellofemoral arthralgia of right knee 04/08/2021   Allergic rhinitis 04/08/2021   Inappropriate sinus tachycardia (HCC) 02/05/2021   Heart palpitations 01/08/2021   Internal and external bleeding hemorrhoids 03/04/2018    Prenatal Transfer Tool  Maternal Diabetes: No Genetic Screening: Normal Maternal Ultrasounds/Referrals: Normal Fetal Ultrasounds or other Referrals:  None Maternal Substance Abuse:  No Significant Maternal Medications:  None Significant Maternal Lab Results: Group B Strep negative  Maternal Medical History:   Past Medical History:  Diagnosis Date   Hemorrhoids    Pelvic pain in female 04/08/2021    Past Surgical History:  Procedure Laterality Date   COLONOSCOPY WITH PROPOFOL  N/A 09/17/2021   Procedure: COLONOSCOPY WITH PROPOFOL ;  Surgeon: Irby Mannan, MD;  Location: [facility] ENDOSCOPY;  Service: Endoscopy;  Laterality: N/A;   WISDOM TOOTH EXTRACTION      No Known Allergies  Prior to Admission medications   Medication Sig Start Date End Date Taking? Authorizing Provider  acetaminophen  (TYLENOL ) 500 MG tablet Take 1 tablet (500 mg total) by mouth every 6 (six) hours as needed. 02/01/24   Awilda Lennox, PA-C  docusate sodium  (COLACE) 100 MG capsule Take 1 capsule (100 mg total) by mouth 2 (two) times daily. 04/07/24 04/07/25  Auston Left, CNM  ferrous sulfate  325 (65 FE) MG EC tablet Take 325 mg by mouth 3 (three) times daily with meals.    [provider]  hydrocortisone  (ANUSOL -HC) 25 MG suppository Place 1 suppository (25 mg total) rectally 2 (two) times daily. 04/07/24   Auston Left, CNM  ondansetron  (ZOFRAN -ODT)  4 MG disintegrating tablet Allow 1-2 tablets to dissolve in your mouth every 8 hours as needed for nausea/vomiting Patient not taking: Reported on 04/07/2024 04/05/23   Lynnda Sas, MD  phentermine  15 MG capsule Take 1 capsule (15 mg  total) by mouth every morning. Patient not taking: Reported on 04/07/2024 05/19/23   Ma Saupe, MD  Prenatal Vit-Fe Fumarate-FA (PRENATAL MULTIVITAMIN) TABS tablet Take 1 tablet by mouth daily at 12 noon.    [provider]  topiramate  (TOPAMAX ) 50 MG tablet Take 1 tablet (50 mg total) by mouth daily. Patient not taking: Reported on 04/07/2024 05/19/23   Ma Saupe, MD  witch hazel-glycerin  (TUCKS) pad Apply 1 Application topically as needed for itching. 04/07/24   Auston Left, CNM    OB History  Gravida Para Term Preterm AB Living  3 1 1  0 1 1  SAB IAB Ectopic Multiple Live Births  1 0 0 0 1    # Outcome Date GA Lbr Len/2nd Weight Sex Type Anes PTL Lv  3 Current           2 Term 05/09/21 [redacted]w[redacted]d / 00:32 3520 g M Vag-Spont EPI  LIV     Name: Mendoza,BOY Caroline     Apgar1: 8  Apgar5: 9  1 SAB              Social History: She  reports that she has never smoked. She has never used smokeless tobacco. She reports that she does not currently use alcohol. She reports that she does not use drugs.  Family History: family history includes Healthy in her brother, mother, sister, and sister; Heart attack in her father; Stroke in her father.   Review of Systems: A full review of systems was performed and negative except as noted in the HPI.     Physical Exam:  Vital Signs: BP 120/73 (BP Location: Right Arm)   Pulse 77   Temp 97.8 F (36.6 C) (Oral)   Resp 15   Ht 5\' 4"  (1.626 m)   Wt 121.6 kg   LMP 07/16/2023 (Exact Date)   BMI 46.00 kg/m   General: no acute distress.  HEENT: normocephalic, atraumatic Heart: regular rate & rhythm Lungs: normal respiratory effort Abdomen: soft, gravid, non-tender;  EFW: 8 1/2 lbs  Pelvic:   External: Normal external female genitalia  Cervix: Dilation: 3.5 / Effacement (%): 50 / Station: -2    Extremities: non-tender, symmetric, No edema bilaterally.  DTRs: 2+/2+  Neurologic: Alert & oriented x 3.    No results  found for this or any previous visit (from the past 24 hours).  Pertinent Results:  Prenatal Labs: Blood type/Rh AB POS Performed at [facility], [address] [place], [place] [address]    Antibody screen Negative    Rubella Immune    Varicella Immune  RPR NR    HBsAg Neg   Hep C NR   HIV Neg    GC neg  Chlamydia neg  Genetic screening cfDNA negative  1 hour GTT 116  3 hour GTT N/A  GBS Neg     FHT:  FHR: 135 bpm, variability: moderate,  accelerations:  Present,  decelerations:  Absent Category/reactivity:  Category I UC:   regular, every 2--4 minutes   Cephalic by Leopolds and SVE   No results found.  Assessment:  Caroline Mendoza is a 33 y.o. G26P1011 female at [redacted]w[redacted]d with SROM and early labor.   Plan:  1. Admit to  Labor & Delivery - Admission status: Inpatient - Dr Hans Lex MD notified of admission and plan of care  - Reason for admission: labor management - consents reviewed and obtained  2. Fetal Well being  - Fetal Tracing: cat 1 - Group B Streptococcus ppx not indicated: GBS negative - Presentation: cephalic confirmed by SVE - US  done 04/12/2024 confirms cephalic presentation   3. Routine OB: - Prenatal labs reviewed, as above - Rh positive - CBC, T&S, RPR on admit -  Labor diet , saline lock  4. Monitoring of labor  - Contractions monitored with external toco - Pelvis proven to 3520 grams, adequate for trial of labor  - Plan for expectant management  - Augmentation with oxytocin  as appropriate  - Plan for  continuous fetal monitoring - Maternal pain control as desired; planning regional anesthesia - Anticipate vaginal delivery  5. Post Partum Planning: - Infant feeding: breast feeding - Contraception: IUD - Flu vaccine: Given prenatally - Tdap vaccine: Given prenatally - RSV vaccine: Not in season   Teodora Fell, [place] 04/16/24 11:35 PM  Fraser Jackson, CNM Certified Nurse Midwife [facility]

## 2024-04-17 ENCOUNTER — Encounter: Payer: Self-pay | Admitting: Obstetrics and Gynecology

## 2024-04-17 ENCOUNTER — Inpatient Hospital Stay: Admitting: Anesthesiology

## 2024-04-17 LAB — TYPE AND SCREEN
ABO/RH(D): AB POS
Antibody Screen: NEGATIVE

## 2024-04-17 LAB — RPR: RPR Ser Ql: NONREACTIVE

## 2024-04-17 MED ORDER — TRANEXAMIC ACID-NACL 1000-0.7 MG/100ML-% IV SOLN
INTRAVENOUS | Status: AC
  Filled 2024-04-17: qty 100

## 2024-04-17 MED ORDER — FENTANYL-BUPIVACAINE-NACL 0.5-0.125-0.9 MG/250ML-% EP SOLN
12.0000 mL/h | EPIDURAL | Status: DC | PRN
  Administered 2024-04-17: 12 mL/h via EPIDURAL

## 2024-04-17 MED ORDER — EPHEDRINE 5 MG/ML INJ
10.0000 mg | INTRAVENOUS | Status: AC | PRN
  Administered 2024-04-17 (×2): 10 mg via INTRAVENOUS
  Filled 2024-04-17: qty 5

## 2024-04-17 MED ORDER — BENZOCAINE-MENTHOL 20-0.5 % EX AERO
1.0000 | INHALATION_SPRAY | CUTANEOUS | Status: DC | PRN
  Filled 2024-04-17: qty 56

## 2024-04-17 MED ORDER — PHENYLEPHRINE 80 MCG/ML (10ML) SYRINGE FOR IV PUSH (FOR BLOOD PRESSURE SUPPORT): 80.0000 ug | PREFILLED_SYRINGE | INTRAVENOUS | Status: DC | PRN

## 2024-04-17 MED ORDER — BISACODYL 10 MG RE SUPP: 10.0000 mg | Freq: Every day | RECTAL | Status: DC | PRN

## 2024-04-17 MED ORDER — ONDANSETRON HCL 4 MG/2ML IJ SOLN: 4.0000 mg | INTRAMUSCULAR | Status: DC | PRN

## 2024-04-17 MED ORDER — CARBOPROST TROMETHAMINE 250 MCG/ML IM SOLN
INTRAMUSCULAR | Status: AC
  Filled 2024-04-17: qty 1

## 2024-04-17 MED ORDER — ZOLPIDEM TARTRATE 5 MG PO TABS: 5.0000 mg | ORAL_TABLET | Freq: Every evening | ORAL | Status: DC | PRN

## 2024-04-17 MED ORDER — LACTATED RINGERS IV SOLN: 500.0000 mL | Freq: Once | INTRAVENOUS | Status: DC

## 2024-04-17 MED ORDER — PRENATAL MULTIVITAMIN CH
1.0000 | ORAL_TABLET | Freq: Every day | ORAL | Status: DC
  Administered 2024-04-18: 1 via ORAL
  Filled 2024-04-17: qty 1

## 2024-04-17 MED ORDER — FLEET ENEMA RE ENEM: 1.0000 | ENEMA | Freq: Every day | RECTAL | Status: DC | PRN

## 2024-04-17 MED ORDER — SENNOSIDES-DOCUSATE SODIUM 8.6-50 MG PO TABS
2.0000 | ORAL_TABLET | ORAL | Status: DC
  Administered 2024-04-17: 2 via ORAL
  Filled 2024-04-17: qty 2

## 2024-04-17 MED ORDER — OXYTOCIN-SODIUM CHLORIDE 30-0.9 UT/500ML-% IV SOLN
1.0000 m[IU]/min | INTRAVENOUS | Status: DC
  Administered 2024-04-17: 2 m[IU]/min via INTRAVENOUS

## 2024-04-17 MED ORDER — SIMETHICONE 80 MG PO CHEW: 80.0000 mg | CHEWABLE_TABLET | ORAL | Status: DC | PRN

## 2024-04-17 MED ORDER — PROPRANOLOL HCL 1 MG/ML IV SOLN
2.0000 mg | Freq: Once | INTRAVENOUS | Status: AC
  Administered 2024-04-17: 2 mg via INTRAVENOUS
  Filled 2024-04-17: qty 2

## 2024-04-17 MED ORDER — ACETAMINOPHEN 325 MG PO TABS
650.0000 mg | ORAL_TABLET | ORAL | Status: DC | PRN
  Administered 2024-04-18 (×2): 650 mg via ORAL
  Filled 2024-04-17 (×2): qty 2

## 2024-04-17 MED ORDER — DIPHENHYDRAMINE HCL 25 MG PO CAPS: 25.0000 mg | ORAL_CAPSULE | Freq: Four times a day (QID) | ORAL | Status: DC | PRN

## 2024-04-17 MED ORDER — TERBUTALINE SULFATE 1 MG/ML IJ SOLN: 0.2500 mg | Freq: Once | INTRAMUSCULAR | Status: DC | PRN

## 2024-04-17 MED ORDER — BUPIVACAINE HCL (PF) 0.25 % IJ SOLN
INTRAMUSCULAR | Status: DC | PRN
  Administered 2024-04-17: 8 mL via EPIDURAL

## 2024-04-17 MED ORDER — IBUPROFEN 600 MG PO TABS
600.0000 mg | ORAL_TABLET | Freq: Four times a day (QID) | ORAL | Status: DC
  Administered 2024-04-17 – 2024-04-18 (×3): 600 mg via ORAL
  Filled 2024-04-17 (×3): qty 1

## 2024-04-17 MED ORDER — METHYLERGONOVINE MALEATE 0.2 MG/ML IJ SOLN
INTRAMUSCULAR | Status: AC
  Filled 2024-04-17: qty 1

## 2024-04-17 MED ORDER — LIDOCAINE-EPINEPHRINE (PF) 1.5 %-1:200000 IJ SOLN
INTRAMUSCULAR | Status: DC | PRN
  Administered 2024-04-17: 3 mL via EPIDURAL

## 2024-04-17 MED ORDER — ONDANSETRON HCL 4 MG PO TABS: 4.0000 mg | ORAL_TABLET | ORAL | Status: DC | PRN

## 2024-04-17 MED ORDER — OXYCODONE HCL 5 MG PO TABS
5.0000 mg | ORAL_TABLET | ORAL | Status: DC | PRN
  Administered 2024-04-18: 5 mg via ORAL
  Filled 2024-04-17: qty 1

## 2024-04-17 MED ORDER — WITCH HAZEL-GLYCERIN EX PADS
1.0000 | MEDICATED_PAD | CUTANEOUS | Status: DC | PRN
  Administered 2024-04-18: 1 via TOPICAL
  Filled 2024-04-17 (×2): qty 100

## 2024-04-17 MED ORDER — COCONUT OIL OIL
1.0000 | TOPICAL_OIL | Status: DC | PRN
  Filled 2024-04-17: qty 15

## 2024-04-17 MED ORDER — DIBUCAINE (PERIANAL) 1 % EX OINT
1.0000 | TOPICAL_OINTMENT | CUTANEOUS | Status: DC | PRN
  Administered 2024-04-18: 1 via RECTAL
  Filled 2024-04-17 (×2): qty 28

## 2024-04-17 MED ORDER — FENTANYL-BUPIVACAINE-NACL 0.5-0.125-0.9 MG/250ML-% EP SOLN
EPIDURAL | Status: AC
  Filled 2024-04-17: qty 250

## 2024-04-17 MED ORDER — EPHEDRINE 5 MG/ML INJ
10.0000 mg | INTRAVENOUS | Status: DC | PRN
  Administered 2024-04-17: 10 mg via INTRAVENOUS
  Filled 2024-04-17: qty 5

## 2024-04-17 MED ORDER — DIPHENHYDRAMINE HCL 50 MG/ML IJ SOLN
50.0000 mg | Freq: Once | INTRAMUSCULAR | Status: AC
  Administered 2024-04-17: 50 mg via INTRAVENOUS

## 2024-04-17 MED ORDER — CALCIUM CARBONATE ANTACID 500 MG PO CHEW
2.0000 | CHEWABLE_TABLET | Freq: Once | ORAL | Status: AC
  Administered 2024-04-17: 400 mg via ORAL
  Filled 2024-04-17: qty 2

## 2024-04-17 MED ORDER — MISOPROSTOL 200 MCG PO TABS
ORAL_TABLET | ORAL | Status: DC
Start: 2024-04-17 — End: 2024-04-17
  Filled 2024-04-17: qty 5

## 2024-04-17 MED ORDER — DIPHENHYDRAMINE HCL 50 MG/ML IJ SOLN
12.5000 mg | INTRAMUSCULAR | Status: DC | PRN
  Filled 2024-04-17: qty 1

## 2024-04-17 NOTE — Anesthesia Procedure Notes (Signed)
 Epidural Patient location during procedure: OB Start time: 04/17/2024 4:54 AM End time: 04/17/2024 5:04 AM  Staffing Anesthesiologist: Dorothey Gate, MD Performed: anesthesiologist   Preanesthetic Checklist Completed: patient identified, IV checked, risks and benefits discussed, surgical consent, monitors and equipment checked, pre-op evaluation and timeout performed  Epidural Patient position: sitting Prep: Betadine Patient monitoring: heart rate, continuous pulse ox and blood pressure Approach: midline Location: L2-L3 Injection technique: LOR air  Needle:  Needle type: Tuohy  Needle gauge: 17 G Needle length: 9 cm Needle insertion depth: 7.5 cm Catheter at skin depth: 12.5 cm Test dose: negative and 1.5% lidocaine  with Epi 1:200 K  Assessment Sensory level: T4  Additional Notes Straightforward placement without apparent complications. Reason for block:procedure for pain

## 2024-04-17 NOTE — Discharge Summary (Signed)
 Postpartum Discharge Summary  Patient Name: Caroline Mendoza DOB: 1991-10-24 MRN: [id number]  Date of admission: 04/16/2024 Delivery date:04/17/2024 Delivering provider: Debraann Livingstone Date of discharge: 04/18/2024  Primary OB: [facility] [id number] last menstrual period was 07/16/2023 (exact date). EDC Estimated Date of Delivery: 04/21/24 Gestational Age at Delivery: [redacted]w[redacted]d   Admitting diagnosis: Delayed delivery after SROM (spontaneous rupture of membranes) [O42.90] Intrauterine pregnancy: [redacted]w[redacted]d     Secondary diagnosis:   Principal Problem:   Delayed delivery after SROM (spontaneous rupture of membranes) Active Problems:   Uterine size-date discrepancy in third trimester   Obesity affecting pregnancy in third trimester   Macrosomia affecting management of mother in third trimester, fetus 1   Excessive weight gain during pregnancy in third trimester   Supervision of high risk pregnancy in third trimester   History of postpartum hemorrhage   Discharge Diagnosis: Term Pregnancy Delivered      Hospital course: Onset of Labor With Vaginal Delivery      33 y.o. yo [id number] at [redacted]w[redacted]d was admitted in Latent Labor on 04/16/2024. Labor course was uncomplicated.  Membrane Rupture Time/Date: 9:30 PM,04/16/2024  Delivery Method:Vaginal, Spontaneous Operative Delivery:N/A Episiotomy: None Lacerations:  2nd degree;Vaginal Patient had a postpartum course without complication.  She is ambulating, tolerating a regular diet, passing flatus, and urinating well. Patient is discharged home in stable condition on 04/18/24.  Newborn Data: Birth date:04/17/2024 Birth time:3:44 PM Gender:Female Living status:Living Apgars:8 ,9  Weight:3900 g                                            Post partum procedures:none Augmentation:: Pitocin  Complications: None Delivery Type: spontaneous vaginal delivery Anesthesia: epidural anesthesia Placenta: spontaneous To Pathology: No    Prenatal Labs:   Blood type/Rh AB POS Performed at [facility], [address] [place], [place] [address]    Antibody screen Negative    Rubella Immune    Varicella Immune  RPR NR    HBsAg Neg   Hep C NR   HIV Neg    GC neg  Chlamydia neg  Genetic screening cfDNA negative  1 hour GTT 116  3 hour GTT N/A  GBS Neg       Magnesium  Sulfate received: No BMZ received: No Rhophylac:was not indicated MMR: was not indicated Varivax vaccine given: was not indicated - Tdap vaccine: Given prenatally - Flu vaccine: Given prenatally -RSV vaccine:not in season  Transfusion:No  Physical exam  Vitals:   04/17/24 2034 04/17/24 2132 04/18/24 0300 04/18/24 0810  BP: (!) 100/52 108/67 104/74 (!) 92/54  Pulse:  86 71 66  Resp:  17 18 18   Temp:  98.5 F (36.9 C) 97.7 F (36.5 C) 98 F (36.7 C)  TempSrc:  Oral Oral Oral  SpO2:  98% 99% 99%  Weight:      Height:       General: alert, cooperative, and no distress Lochia: appropriate Uterine Fundus: firm Perineum: minimal edema/repair well approximated DVT Evaluation: No evidence of DVT seen on physical exam.  Labs: Lab Results  Component Value Date   WBC 10.5 04/18/2024   HGB 11.0 (L) 04/18/2024   HCT 33.3 (L) 04/18/2024   MCV 87.4 04/18/2024   PLT 232 04/18/2024      Latest Ref Rng & Units 04/04/2023    8:30 PM  CMP  Glucose 70 - 99 mg/dL 841  BUN 6 - 20 mg/dL 13   Creatinine 4.09 - 1.00 mg/dL 8.11   Sodium 914 - 782 mmol/L 135   Potassium 3.5 - 5.1 mmol/L 4.0   Chloride 98 - 111 mmol/L 106   CO2 22 - 32 mmol/L 23   Calcium  8.9 - 10.3 mg/dL 9.1    Edinburgh Score:    04/18/2024    8:00 AM  Edinburgh Postnatal Depression Scale Screening Tool  I have been able to laugh and see the funny side of things. 0  I have looked forward with enjoyment to things. 0  I have blamed myself unnecessarily when things went wrong. 1  I have been anxious or worried for no good reason. 1  I have felt scared or  panicky for no good reason. 0  Things have been getting on top of me. 1  I have been so unhappy that I have had difficulty sleeping. 0  I have felt sad or miserable. 0  I have been so unhappy that I have been crying. 0  The thought of harming myself has occurred to me. 0  Edinburgh Postnatal Depression Scale Total 3    Risk assessment for postpartum VTE and prophylactic treatment: Very high risk factors: None High risk factors: BMI 40-50 kg/m2 Moderate risk factors: None  Postpartum VTE prophylaxis with LMWH not indicated  After visit meds:  Allergies as of 04/18/2024   No Known Allergies      Medication List     STOP taking these medications    phentermine  15 MG capsule   topiramate  50 MG tablet Commonly known as: Topamax        TAKE these medications    acetaminophen  325 MG tablet Commonly known as: Tylenol  Take 2 tablets (650 mg total) by mouth every 6 (six) hours as needed for mild pain (pain score 1-3) or fever (for pain scale < 4). What changed:  medication strength how much to take reasons to take this   benzocaine -Menthol  20-0.5 % Aero Commonly known as: DERMOPLAST Apply 1 Application topically as needed for irritation (perineal discomfort).   docusate sodium  100 MG capsule Commonly known as: Colace Take 1 capsule (100 mg total) by mouth 2 (two) times daily.   ferrous sulfate  325 (65 FE) MG EC tablet Take 325 mg by mouth 3 (three) times daily with meals.   hydrocortisone  25 MG suppository Commonly known as: ANUSOL -HC Place 1 suppository (25 mg total) rectally 2 (two) times daily.   ibuprofen  600 MG tablet Commonly known as: ADVIL  Take 1 tablet (600 mg total) by mouth every 6 (six) hours as needed for fever or mild pain (pain score 1-3).   ondansetron  4 MG disintegrating tablet Commonly known as: ZOFRAN -ODT Allow 1-2 tablets to dissolve in your mouth every 8 hours as needed for nausea/vomiting   prenatal multivitamin Tabs tablet Take 1 tablet  by mouth daily at 12 noon.   senna-docusate 8.6-50 MG tablet Commonly known as: Senokot-S Take 2 tablets by mouth at bedtime as needed for mild constipation.   witch hazel-glycerin  pad Commonly known as: TUCKS Apply 1 Application topically as needed for hemorrhoids (for pain). What changed: reasons to take this       Discharge home in stable condition Infant Feeding: Bottle Infant Disposition:home with mother Discharge instruction: per After Visit Summary and Postpartum booklet. Activity: Advance as tolerated. Pelvic rest for 6 weeks.  Diet: routine diet Anticipated Birth Control:  Contraceptives: IUD Mirena Postpartum Appointment:6 weeks Additional Postpartum F/U: none Future Appointments:No future  appointments. Follow up Visit:  Follow-up Information     Arzella Laurence, CNM Follow up in 6 week(s).   Specialty: Obstetrics Why: pp visit and Mirena IUD placement Contact information: [address] [place] [address] [phone]                 Plan:  Kathaline Cochenour was discharged to home in good condition. Follow-up appointment as directed.    Signed:  Auston Left, CNM 04/18/2024

## 2024-04-17 NOTE — Progress Notes (Signed)
 L&D Note    Subjective:  Resting in bed with spouse at bedside  Objective:   Vitals:   04/17/24 0950 04/17/24 1148 04/17/24 1235 04/17/24 1239  BP:  (!) 110/93  109/69  Pulse:  (!) 110  72  Resp:      Temp:      TempSrc:      SpO2: 100% 99% 97%   Weight:      Height:        Current Vital Signs 24h Vital Sign Ranges  T 98.2 F (36.8 C) Temp  Avg: 98 F (36.7 C)  Min: 97.8 F (36.6 C)  Max: 98.2 F (36.8 C)  BP 109/69 BP  Min: 66/43  Max: 122/72  HR 72 Pulse  Avg: 81.9  Min: 69  Max: 173  RR 16 Resp  Avg: 15.5  Min: 15  Max: 16  SaO2 97 %   SpO2  Avg: 98.8 %  Min: 96 %  Max: 100 %      Gen: alert, cooperative, no distress FHR: Baseline: 145 bpm, Variability: moderate, Accels: Present, Decels:intermittent  variables and early Toco: regular, every 2 minutes SVE: Dilation: 7 Effacement (%): 80 Cervical Position: Middle Station: 0 Presentation: Vertex Exam by:: Calton Catholic, CNM  Medications SCHEDULED MEDICATIONS   oxytocin  40 units in LR 1000 mL  333 mL Intravenous Once   propranolol  2 mg Intravenous Once   sodium chloride  flush  3 mL Intravenous Q12H    MEDICATION INFUSIONS   sodium chloride      fentaNYL  2 mcg/mL w/bupivacaine  0.125% in NS 250 mL 8 mL/hr (04/17/24 0640)   lactated ringers      lactated ringers  500 mL (04/17/24 0243)   lactated ringers  125 mL/hr at 04/17/24 1233   oxytocin      oxytocin  10 milli-units/min (04/17/24 1234)    PRN MEDICATIONS  sodium chloride , acetaminophen , diphenhydrAMINE , ePHEDrine , fentaNYL  (SUBLIMAZE ) injection, fentaNYL  2 mcg/mL w/bupivacaine  0.125% in NS 250 mL, lactated ringers , lidocaine  (PF), ondansetron , phenylephrine , phenylephrine , sodium chloride  flush, sodium citrate-citric acid , terbutaline    Assessment & Plan:  33 y.o. G3P1011 at [redacted]w[redacted]d admitted for Labor_induction_indication: Spontaneous rupture of BOW -GBS: negative -IP Antibiotics: abx: none -Membranes ruptured, clear fluid -Recheck:Evaluated by digital  exam. -Preeclampsia:  labs stable -Pain: none -Intervention: continue present management, change maternal position, and administer IV Benadryl , IV Propanolol and Tums -Pe_uterus_labor: Pitocin  at 12 mu/min., Uterine activity demonstrates 220 Motivideo Units (mmHg over baseline x UCs/10 min), Adequate uterine activity (> 3 UCs, >41mm over baseline, < 4 min interval)., and Adequate relaxation between contractions. -Analgesia: regional anesthesia   Jermani Eberlein, CNM  04/17/2024 2:23 PM  [facility]

## 2024-04-17 NOTE — Anesthesia Preprocedure Evaluation (Signed)
 Anesthesia Evaluation  Patient identified by MRN, date of birth, ID band Patient awake    Reviewed: Allergy & Precautions, NPO status , Patient's Chart, lab work & pertinent test results  History of Anesthesia Complications Negative for: history of anesthetic complications  Airway Mallampati: III   Neck ROM: Full    Dental   Pulmonary neg pulmonary ROS   Pulmonary exam normal breath sounds clear to auscultation       Cardiovascular Exercise Tolerance: Good negative cardio ROS Normal cardiovascular exam Rhythm:Regular Rate:Normal     Neuro/Psych negative neurological ROS     GI/Hepatic negative GI ROS,,,  Endo/Other    Class 3 obesity  Renal/GU negative Renal ROS     Musculoskeletal   Abdominal   Peds  Hematology negative hematology ROS (+)   Anesthesia Other Findings 33 yo G3P1011 at 53 3/7 requesting labor epidural.  Reproductive/Obstetrics (+) Pregnancy Fetal macrosomia                             Anesthesia Physical Anesthesia Plan  ASA: 3  Anesthesia Plan: Epidural   Post-op Pain Management:    Induction:   PONV Risk Score and Plan: 2 and Treatment may vary due to age or medical condition  Airway Management Planned: Natural Airway  Additional Equipment:   Intra-op Plan:   Post-operative Plan:   Informed Consent: I have reviewed the patients History and Physical, chart, labs and discussed the procedure including the risks, benefits and alternatives for the proposed anesthesia with the patient or authorized representative who has indicated his/her understanding and acceptance.     Dental Advisory Given  Plan Discussed with:   Anesthesia Plan Comments: (Patient reports no bleeding problems and no anticoagulant use.   Patient consented for risks of anesthesia including but not limited to:  - adverse reactions to medications - risk of bleeding, infection and or  nerve damage from epidural that could lead to paralysis - risk of headache or failed epidural - nerve damage due to positioning - that if epidural is used for C-section that there is a chance of epidural failure requiring spinal placement or conversion to GA - damage to heart, brain, lungs, other parts of body or loss of life  Patient voiced understanding and assent.)       Anesthesia Quick Evaluation

## 2024-04-17 NOTE — Progress Notes (Signed)
 L&D Note    Subjective:  Feeling comfortable with epidural  Objective:   Vitals:   04/17/24 0935 04/17/24 0940 04/17/24 0945 04/17/24 0950  BP:  113/73    Pulse:  79    Resp:      Temp:      TempSrc:      SpO2: 99% 99% 99% 100%  Weight:      Height:        Current Vital Signs 24h Vital Sign Ranges  T 98.2 F (36.8 C) Temp  Avg: 98 F (36.7 C)  Min: 97.8 F (36.6 C)  Max: 98.2 F (36.8 C)  BP 113/73 BP  Min: 66/43  Max: 122/72  HR 79 Pulse  Avg: 81.4  Min: 69  Max: 173  RR 16 Resp  Avg: 15.5  Min: 15  Max: 16  SaO2 100 %   SpO2  Avg: 98.8 %  Min: 96 %  Max: 100 %      Gen: alert, cooperative, no distress FHR: Baseline: 125 bpm, Variability: moderate, Accels: Present, Decels: none Toco: 2.5-6 min SVE: Dilation: 5 Effacement (%): 80 Cervical Position: Middle Station: -1 Presentation: Vertex Exam by:: Calton Catholic, CNM  Medications SCHEDULED MEDICATIONS   ammonia        lidocaine  (PF)       misoprostol        oxytocin        oxytocin  40 units in LR 1000 mL  333 mL Intravenous Once   Oxytocin -Sodium Chloride        sodium chloride  flush  3 mL Intravenous Q12H    MEDICATION INFUSIONS   sodium chloride      fentaNYL  2 mcg/mL w/bupivacaine  0.125% in NS 250 mL 8 mL/hr (04/17/24 0640)   lactated ringers      lactated ringers  500 mL (04/17/24 0243)   lactated ringers  125 mL/hr at 04/17/24 0425   oxytocin       PRN MEDICATIONS  sodium chloride , acetaminophen , ammonia , diphenhydrAMINE , ePHEDrine , fentaNYL  (SUBLIMAZE ) injection, fentaNYL  2 mcg/mL w/bupivacaine  0.125% in NS 250 mL, lactated ringers , lidocaine  (PF), lidocaine  (PF), misoprostol , ondansetron , oxytocin , Oxytocin -Sodium Chloride , phenylephrine , phenylephrine , sodium chloride  flush, sodium citrate-citric acid    Assessment & Plan:  33 y.o. G3P1011 at [redacted]w[redacted]d admitted for Labor_induction_indication: Spontaneous rupture of BOW -GBS: negative -IP Antibiotics: abx: none -Membranes ruptured, clear fluid -Recheck:Evaluated  by digital exam. -Preeclampsia:  labs stable -Pain: none -Intervention: IV Pitocin  augmentation, change maternal position, anticipate vaginal delivery, and place IUPC -Pe_uterus_labor: Inadequate uterine activity by palpation and/or maternal perception. and Adequate relaxation between contractions. -Analgesia: regional anesthesia   Vista Sawatzky, CNM  04/17/2024 10:24 AM  [facility]

## 2024-04-17 NOTE — Progress Notes (Signed)
 L&D Note    Subjective:  More comfortable after epidural  Objective:   Vitals:   04/17/24 0530 04/17/24 0531 04/17/24 0535 04/17/24 0536  BP:  (!) 96/46    Pulse:  78    Resp:      Temp:      TempSrc:      SpO2: 98%  99% 99%  Weight:      Height:        Current Vital Signs 24h Vital Sign Ranges  T 97.8 F (36.6 C) Temp  Avg: 97.8 F (36.6 C)  Min: 97.8 F (36.6 C)  Max: 97.8 F (36.6 C)  BP (!) 96/46 BP  Min: 89/41  Max: 122/72  HR 78 Pulse  Avg: 77.4  Min: 71  Max: 86  RR 15 Resp  Avg: 15  Min: 15  Max: 15  SaO2 99 %   SpO2  Avg: 99.1 %  Min: 96 %  Max: 100 %      Gen: alert, cooperative, no distress FHR: Baseline: 130 bpm, Variability: moderate, Accels: Present, Decels: none Toco: regular, every 3-5 minutes SVE: Dilation: 5 Effacement (%): 80 Cervical Position: Middle Station: -1 Presentation: Vertex Exam by:: Chalice Colt, RN  Medications SCHEDULED MEDICATIONS   ammonia        lidocaine  (PF)       misoprostol        oxytocin        oxytocin  40 units in LR 1000 mL  333 mL Intravenous Once   Oxytocin -Sodium Chloride        sodium chloride  flush  3 mL Intravenous Q12H    MEDICATION INFUSIONS   sodium chloride      fentaNYL  2 mcg/mL w/bupivacaine  0.125% in NS 250 mL 12 mL/hr (04/17/24 0507)   lactated ringers      lactated ringers  500 mL (04/17/24 0243)   lactated ringers  125 mL/hr at 04/17/24 0425   oxytocin       PRN MEDICATIONS  sodium chloride , acetaminophen , ammonia , diphenhydrAMINE , ePHEDrine , ePHEDrine , fentaNYL  (SUBLIMAZE ) injection, fentaNYL  2 mcg/mL w/bupivacaine  0.125% in NS 250 mL, lactated ringers , lidocaine  (PF), lidocaine  (PF), misoprostol , ondansetron , oxytocin , Oxytocin -Sodium Chloride , phenylephrine , phenylephrine , sodium chloride  flush, sodium citrate-citric acid    Assessment & Plan:  33 y.o. G3P1011 at [redacted]w[redacted]d admitted for SROM -Labor: Early labor, adequate cervical change  -Fetal Well-being: Category I -GBS: negative -Membranes ruptured, clear  fluid, spontaneously at 2130 -Expectant management. Will consider augmentation with oxytocin  if clinically indicated -Analgesia: regional anesthesia   Teodora Fell, CNM  04/17/2024 5:36 AM  [facility]

## 2024-04-17 NOTE — Lactation Note (Signed)
 This note was copied from a baby's chart. Lactation Consultation Note  Patient Name: Caroline Mendoza [id number] Date: 04/17/2024 Age:31 hours Reason for consult: L&D Initial assessment;Term   Maternal Data Does the patient have breastfeeding experience prior to this delivery?: Yes How long did the patient breastfeed?: 3 months  Initial assessment w/ infant and family.  This was a SVD.    Patient stated that she fed first child for 3 months, but had latching issues.  She would like to breastfeed for at least a year with this infant.  Feeding Mother's Current Feeding Choice: Breast Milk  Patient breastfeeding infant upon entry into the room. LC did not bother infant as he was latched good and patient holding infant properly.  Interventions Interventions: Education;Breast feeding basics reviewed  LC provided education of feeding infant at least 8x w/in a 24hr period and following infants feeding cues.  While speaking to patient, she was very sleepy therefore all education was not provided to her.  Discharge Pump: Personal;Hands [name] ([employer]) WIC Program: No  Consult Status Consult Status: Follow-up Date: 04/18/24 Follow-up type: In-patient    Darian Ace S Syvanna Ciolino 04/17/2024, 4:42 PM

## 2024-04-18 LAB — CBC
HCT: 33.3 % — ABNORMAL LOW (ref 36.0–46.0)
Hemoglobin: 11 g/dL — ABNORMAL LOW (ref 12.0–15.0)
MCH: 28.9 pg (ref 26.0–34.0)
MCHC: 33 g/dL (ref 30.0–36.0)
MCV: 87.4 fL (ref 80.0–100.0)
Platelets: 232 10*3/uL (ref 150–400)
RBC: 3.81 MIL/uL — ABNORMAL LOW (ref 3.87–5.11)
RDW: 15.4 % (ref 11.5–15.5)
WBC: 10.5 10*3/uL (ref 4.0–10.5)
nRBC: 0 % (ref 0.0–0.2)

## 2024-04-18 MED ORDER — WITCH HAZEL-GLYCERIN EX PADS
1.0000 | MEDICATED_PAD | CUTANEOUS | 0 refills | Status: AC | PRN
Start: 2024-04-18 — End: ?

## 2024-04-18 MED ORDER — SENNOSIDES-DOCUSATE SODIUM 8.6-50 MG PO TABS
2.0000 | ORAL_TABLET | Freq: Every evening | ORAL | 0 refills | Status: AC | PRN
Start: 2024-04-18 — End: ?

## 2024-04-18 MED ORDER — BENZOCAINE-MENTHOL 20-0.5 % EX AERO
1.0000 | INHALATION_SPRAY | CUTANEOUS | 0 refills | Status: AC | PRN
Start: 2024-04-18 — End: ?

## 2024-04-18 MED ORDER — ACETAMINOPHEN 325 MG PO TABS: 650.0000 mg | ORAL_TABLET | Freq: Four times a day (QID) | ORAL | 0 refills | Status: AC | PRN

## 2024-04-18 MED ORDER — IBUPROFEN 600 MG PO TABS: 600.0000 mg | ORAL_TABLET | Freq: Four times a day (QID) | ORAL | 0 refills | Status: AC | PRN

## 2024-04-18 NOTE — Progress Notes (Signed)
 Post Partum Day 1 Subjective: Doing well, no complaints.  Tolerating regular diet, pain with PO meds, voiding and ambulating without difficulty.  No CP SOB Fever,Chills, N/V or leg pain; denies nipple or breast pain, no HA change of vision, RUQ/epigastric pain  Objective: BP (!) 92/54 (BP Location: Left Arm)   Pulse 66   Temp 98 F (36.7 C) (Oral)   Resp 18   Ht 5\' 4"  (1.626 m)   Wt 121.6 kg   LMP 07/16/2023 (Exact Date)   SpO2 99%   Breastfeeding Unknown   BMI 46.00 kg/m    Physical Exam:  General: NAD Breasts: soft/nontender CV: RRR Pulm: nl effort, CTABL Abdomen: soft, NT, BS x 4 Perineum: minimal edema, repair well approximated Lochia: moderate Uterine Fundus: fundus firm and 1 fb below umbilicus DVT Evaluation: no cords, ttp LEs   Recent Labs    04/16/24 2338 04/18/24 0530  HGB 12.3 11.0*  HCT 37.4 33.3*  WBC 9.6 10.5  PLT 278 232    Assessment/Plan: 33 y.o. [id number] postpartum day # 1  - Continue routine PP care - Lactation consult PRN - Discussed contraceptive options including implant, IUDs hormonal and non-hormonal, injection, pills/ring/patch, condoms, and NFP.  - Acute blood loss anemia, clinically insignificant - hemoglobin changed from 12.3 to 11.0, patient is asymptomatic, hemodynamically stable; start po ferrous sulfate  BID with stool softeners - Immunization status: all Imms up to date  Disposition: Does not desire Dc home today.   Auston Left, CNM 04/18/2024 10:25 AM

## 2024-04-18 NOTE — Anesthesia Postprocedure Evaluation (Signed)
 Anesthesia Post Note  Patient: Caroline Mendoza  Procedure(s) Performed: AN AD HOC LABOR EPIDURAL  Patient location during evaluation: Mother Baby Anesthesia Type: Epidural Level of consciousness: awake and alert and oriented Pain management: pain level controlled Vital Signs Assessment: post-procedure vital signs reviewed and stable Respiratory status: spontaneous breathing and respiratory function stable Cardiovascular status: blood pressure returned to baseline Postop Assessment: no headache, no backache, no apparent nausea or vomiting, adequate PO intake and able to ambulate Anesthetic complications: no   No notable events documented.   Last Vitals:  Vitals:   04/18/24 0300 04/18/24 0810  BP: 104/74 (!) 92/54  Pulse: 71 66  Resp: 18 18  Temp: 36.5 C 36.7 C  SpO2: 99% 99%    Last Pain:  Vitals:   04/18/24 0810  TempSrc: Oral  PainSc:                  [name]

## 2024-04-18 NOTE — Lactation Note (Signed)
 This note was copied from a baby's chart. Lactation Consultation Note  Patient Name: Caroline Mendoza [id number] Date: 04/18/2024 Age:31 hours Reason for consult: Follow-up assessment;Term   Maternal Data This is mom's 2nd baby, SVD. Mom with history of obesity and post partum hemorrhage with previous baby.  On follow-up visit today mom was breastfeeding when [facility] entered the room. Mom with normative nipple tenderness. Also, mom had some routine breastfeeding questions. Has patient been taught Hand Expression?: Yes Does the patient have breastfeeding experience prior to this delivery?: Yes How long did the patient breastfeed?: 3 months  Feeding Mother's Current Feeding Choice: Breast Milk Observed breastfeeding. Mom independently positioning baby at the breast. Provided mom with tips and strategies to maximize position and latch technique. Baby was latched well. Audible swallows noted.  LATCH Score Latch: Grasps breast easily, tongue down, lips flanged, rhythmical sucking.  Audible Swallowing: Spontaneous and intermittent  Type of Nipple: Everted at rest and after stimulation  Comfort (Breast/Nipple): Soft / non-tender  Hold (Positioning): No assistance needed to correctly position infant at breast.  LATCH Score: 10  Interventions Interventions: Breast feeding basics reviewed;Coconut oil;Hand express;Adjust position;Education LC number on white board if mom has any other lactation questions or needs.  Discharge Discharge Education: Engorgement and breast care;Warning signs for feeding baby;Outpatient recommendation Pump: Personal (Mom cozies) WIC Program: No  Consult Status Consult Status: PRN Date: 04/18/24 Follow-up type: In-patient  Update provided to care nurse.  Angelica Kemp 04/18/2024, 1:11 PM

## 2024-04-18 NOTE — Progress Notes (Signed)
 Patient discharged home with family.  Discharge instructions, when to follow up, and medications reviewed with patient.  Patient verbalized understanding. Patient will be escorted out by staff.

## 2024-04-18 NOTE — Discharge Instructions (Signed)
 Call office if you have any of the following:  -Persistent headache or visual changes (possible high blood pressure) -Fever >101.0 F or chills (possible infection) -Breast concerns (engorgement, mastitis) -Excessive vaginal bleeding (soaking through more than one pad in 1 hr x 2 hr) -Incision drainage/redness/increased pain/warmth at site (possible infection)  -Leg pain or redness (possible blood clot) -Depression/anxiety increased symptoms 2 weeks after delivery  Activity & Hygiene: -Do not lift > 10 lbs for 6 weeks.  -No intercourse or tampons for 6 weeks.  -No swimming pools, hot tubs or tub baths- showers only for 6 weeks **No driving for 1-2 weeks or while taking pain medication after c-section  -It is normal to bleed for up to 6 weeks. You should not soak through more than 1 pad in 1 hour x 2 hours.  Breastfeeding: -Continue prenatal vitamin.  -Increase calories and fluids.  -Your milk will come in, in the next couple of days (right now it is colostrum).  -You may have a slight fever when your milk comes in, but it should go away on its own.   -If it does not, and rises above 101 F please call the doctor.  -You will also feel achy and your breasts will be firm. They will also start to leak.  *For breastfeeding concerns, the lactation consultant can be reached at [phone]  Not Breastfeeding: -Avoid breast stimulation and wear supportive bra -ICE helps decrease inflammation and pain -Express milk for comfort by hand, do not empty breast  Postpartum blues: -feelings of happy one minute and sad another minute are normal for the first few weeks. -if it gets worse please let your doctor know. It is very common!!

## 2024-05-14 ENCOUNTER — Ambulatory Visit: Admitting: Family Medicine

## 2024-05-14 ENCOUNTER — Encounter: Payer: Self-pay | Admitting: Family Medicine

## 2024-05-14 DIAGNOSIS — G478 Other sleep disorders: Secondary | ICD-10-CM | POA: Diagnosis not present

## 2024-05-15 NOTE — Progress Notes (Signed)
 Primary Care / Sports Medicine Office Visit  Patient Information:  Patient ID: Caroline Mendoza, female DOB: 05-May-1991 Age: 33 y.o. MRN: [id number]   Caroline Mendoza is a pleasant 33 y.o. female presenting with the following:  Chief Complaint  Patient presents with   Weight Management Screening    Patient presents today to discuss weight management options.    Vitals:   05/14/24 1311  BP: 114/76  Pulse: 74  SpO2: 98%   Vitals:   05/14/24 1311  Weight: 250 lb 12.8 oz (113.8 kg)  Height: 5\' 4"  (1.626 m)   Body mass index is 43.05 kg/m.  No results found.   Independent interpretation of notes and tests performed by another provider:   None  Procedures performed:   None  Pertinent History, Exam, Impression, and Recommendations:   Problem List Items Addressed This Visit     Non-restorative sleep   She has not been tested for sleep apnea, although it was mentioned in past discussions. She experiences sensations of falling asleep and waking up suddenly, which she describes as feeling real and startling. These episodes have occurred during her pregnancy and a few times postpartum. She is curious if these experiences are related to sleep apnea or sleep deprivation.  Sleep apnea evaluation Potential sleep apnea not evaluated. Explained link between sleep apnea, weight gain, and disrupted sleep. Sleep study recommended to assess impact on weight management and insurance coverage for medications. - Refer to sleep medicine for evaluation and potential home sleep study. - Discuss benefits of treating sleep apnea on weight management and health.      Relevant Orders   Ambulatory referral to Pulmonology   Severe obesity (BMI >= 40) (HCC) - Primary   History of Present Illness Caroline Mendoza is a 33 year old female who presents for weight management postpartum.  She is currently breastfeeding her baby and is concerned about the  safety of weight loss medications during this period. She is approximately 40 pounds above her pre-pregnancy weight and is interested in maintaining a healthy diet to support both her and her baby's nutritional needs. She inquires about the use of protein and collagen powders while breastfeeding.  She has previously used phentermine  and topiramate  for weight management and still has phentermine  pills at home.  She is a few months postpartum and inquired about resuming physical activity.     05/14/2024    1:11 PM 04/18/2024    8:10 AM 04/18/2024    3:00 AM  Vitals with BMI  Height 5\' 4"     Weight 250 lbs 13 oz    BMI 43.03    Systolic 114 92 104  Diastolic 76 54 74  Pulse 74 66 71   Weight management Postpartum weight management options limited somewhat by breastfeeding. Limited medication options due to breastfeeding. Emphasized lifestyle modifications as primary focus at this stage. Discussed future medication options post-breastfeeding. - Refer to nutritionist for dietary guidance. - Encourage tracking caloric intake, balanced diet with adequate macronutrients. - Advise regular physical activity, starting with walking.  Breastfeeding Breastfeeding impacts weight management strategy. Discussed caloric demands and nutritional needs. Safe supplementation options reviewed. - Encourage continued breastfeeding for caloric expenditure. - Ensure adequate caloric intake for milk production and nutrition. - Discuss safe protein and collagen supplementation.      Relevant Orders   Amb Referral to Nutrition and Diabetic Education     Orders & Medications Medications: No orders of the defined types  were placed in this encounter.  Orders Placed This Encounter  Procedures   Amb Referral to Nutrition and Diabetic Education   Ambulatory referral to Pulmonology     Return for CPE.     Ma Saupe, MD, [facility]   Primary Care Sports Medicine Primary Care and Sports Medicine at  [facility]

## 2024-05-15 NOTE — Patient Instructions (Signed)
 Patient Action Plan  1. Non-Restorative Sleep and Potential Sleep Apnea:    - Schedule an evaluation with a sleep medicine specialist for a potential home sleep study.    - Discuss how treating sleep apnea can aid in weight management and overall health.  2. Weight Management Postpartum:    - Schedule an appointment with a nutritionist for personalized dietary guidance.    - Track your caloric intake and ensure a balanced diet with adequate macronutrients.    - Start regular physical activity, beginning with walking, as you feel comfortable.  3. Breastfeeding and Nutrition:    - Continue breastfeeding to support caloric expenditure.    - Maintain adequate caloric intake to support milk production and your nutritional needs.    - Discuss with your healthcare provider about safe protein and collagen supplements during breastfeeding.  Red Flags: If you experience significant changes in your health or encounter any issues with sleep or weight management, contact your healthcare provider for further guidance. If you have concerns about breastfeeding or dietary supplements, seek advice from a healthcare professional.

## 2024-05-15 NOTE — Assessment & Plan Note (Signed)
 She has not been tested for sleep apnea, although it was mentioned in past discussions. She experiences sensations of falling asleep and waking up suddenly, which she describes as feeling real and startling. These episodes have occurred during her pregnancy and a few times postpartum. She is curious if these experiences are related to sleep apnea or sleep deprivation.  Sleep apnea evaluation Potential sleep apnea not evaluated. Explained link between sleep apnea, weight gain, and disrupted sleep. Sleep study recommended to assess impact on weight management and insurance coverage for medications. - Refer to sleep medicine for evaluation and potential home sleep study. - Discuss benefits of treating sleep apnea on weight management and health.

## 2024-05-15 NOTE — Assessment & Plan Note (Signed)
 History of Present Illness Caroline Mendoza is a 33 year old female who presents for weight management postpartum.  She is currently breastfeeding her baby and is concerned about the safety of weight loss medications during this period. She is approximately 40 pounds above her pre-pregnancy weight and is interested in maintaining a healthy diet to support both her and her baby's nutritional needs. She inquires about the use of protein and collagen powders while breastfeeding.  She has previously used phentermine  and topiramate  for weight management and still has phentermine  pills at home.  She is a few months postpartum and inquired about resuming physical activity.     05/14/2024    1:11 PM 04/18/2024    8:10 AM 04/18/2024    3:00 AM  Vitals with BMI  Height 5\' 4"     Weight 250 lbs 13 oz    BMI 43.03    Systolic 114 92 104  Diastolic 76 54 74  Pulse 74 66 71   Weight management Postpartum weight management options limited somewhat by breastfeeding. Limited medication options due to breastfeeding. Emphasized lifestyle modifications as primary focus at this stage. Discussed future medication options post-breastfeeding. - Refer to nutritionist for dietary guidance. - Encourage tracking caloric intake, balanced diet with adequate macronutrients. - Advise regular physical activity, starting with walking.  Breastfeeding Breastfeeding impacts weight management strategy. Discussed caloric demands and nutritional needs. Safe supplementation options reviewed. - Encourage continued breastfeeding for caloric expenditure. - Ensure adequate caloric intake for milk production and nutrition. - Discuss safe protein and collagen supplementation.

## 2024-05-20 ENCOUNTER — Telehealth: Payer: Self-pay | Admitting: Sleep Medicine

## 2024-05-20 NOTE — Telephone Encounter (Signed)
 Patient needs to be rescheduled to Dr. Kieran Pellet for sleep consult.

## 2024-05-21 NOTE — Telephone Encounter (Signed)
 Attempted to call patient twice to be rescheduled to Dr, Kieran Pellet for sleep consult.

## 2024-07-15 ENCOUNTER — Ambulatory Visit: Admitting: Pulmonary Disease

## 2024-07-16 ENCOUNTER — Ambulatory Visit: Admitting: Sleep Medicine

## 2024-09-06 ENCOUNTER — Encounter: Admitting: Family Medicine

## 2024-09-20 ENCOUNTER — Encounter: Admitting: Family Medicine

## 2024-11-15 ENCOUNTER — Ambulatory Visit (INDEPENDENT_AMBULATORY_CARE_PROVIDER_SITE_OTHER): Admitting: Family Medicine

## 2024-11-15 ENCOUNTER — Ambulatory Visit
Admission: RE | Admit: 2024-11-15 | Discharge: 2024-11-15 | Disposition: A | Attending: Family Medicine | Admitting: Family Medicine

## 2024-11-15 ENCOUNTER — Encounter: Payer: Self-pay | Admitting: Family Medicine

## 2024-11-15 ENCOUNTER — Ambulatory Visit
Admission: RE | Admit: 2024-11-15 | Discharge: 2024-11-15 | Disposition: A | Source: Ambulatory Visit | Attending: Family Medicine

## 2024-11-15 VITALS — BP 116/72 | HR 107 | Ht 64.0 in | Wt 254.0 lb

## 2024-11-15 DIAGNOSIS — M79673 Pain in unspecified foot: Secondary | ICD-10-CM

## 2024-11-15 DIAGNOSIS — E785 Hyperlipidemia, unspecified: Secondary | ICD-10-CM

## 2024-11-15 DIAGNOSIS — J3089 Other allergic rhinitis: Secondary | ICD-10-CM | POA: Diagnosis not present

## 2024-11-15 DIAGNOSIS — Z0000 Encounter for general adult medical examination without abnormal findings: Secondary | ICD-10-CM | POA: Diagnosis not present

## 2024-11-15 DIAGNOSIS — K648 Other hemorrhoids: Secondary | ICD-10-CM

## 2024-11-15 DIAGNOSIS — G478 Other sleep disorders: Secondary | ICD-10-CM

## 2024-11-15 DIAGNOSIS — M25579 Pain in unspecified ankle and joints of unspecified foot: Secondary | ICD-10-CM

## 2024-11-15 DIAGNOSIS — M25561 Pain in right knee: Secondary | ICD-10-CM

## 2024-11-15 DIAGNOSIS — I4711 Inappropriate sinus tachycardia, so stated: Secondary | ICD-10-CM

## 2024-11-15 DIAGNOSIS — K644 Residual hemorrhoidal skin tags: Secondary | ICD-10-CM

## 2024-11-15 NOTE — Progress Notes (Signed)
 Annual Physical Exam Visit  Patient Information:  Patient ID: Caroline Mendoza, female DOB: [birth year] Age: 33 y.o. MRN: [id number]   Subjective:   CC: Annual Physical Exam  HPI:  Caroline Mendoza is here for their annual physical.  I reviewed the past medical history, family history, social history, surgical history, and allergies today and changes were made as necessary.  Please see the problem list section below for additional details.  Past Medical History: Past Medical History:  Diagnosis Date   Hemorrhoids    History of postpartum hemorrhage 04/16/2024   From obstetrical laceration      Pelvic pain in female 04/08/2021   Past Surgical History: Past Surgical History:  Procedure Laterality Date   COLONOSCOPY WITH PROPOFOL  N/A 09/17/2021   Procedure: COLONOSCOPY WITH PROPOFOL ;  Surgeon: Janalyn Keene NOVAK, MD;  Location: [facility] ENDOSCOPY;  Service: Endoscopy;  Laterality: N/A;   WISDOM TOOTH EXTRACTION     Family History: Family History  Problem Relation Age of Onset   Healthy Mother    Heart attack Father        2000   Stroke Father        2020   Healthy Sister    Healthy Brother    Healthy Sister    Allergies: No Known Allergies Health Maintenance: Health Maintenance  Topic Date Due   Hepatitis B Vaccines 19-59 Average Risk (1 of 3 - 19+ 3-dose series) Never done   HPV VACCINES (1 - 3-dose SCDM series) Never done   Cervical Cancer Screening (HPV/Pap Cotest)  Never done   Influenza Vaccine  07/12/2024   COVID-19 Vaccine (1 - 2025-26 season) Never done   DTaP/Tdap/Td (3 - Td or Tdap) 01/23/2034   Hepatitis C Screening  Completed   HIV Screening  Completed   Pneumococcal Vaccine  Aged Out   Meningococcal B Vaccine  Aged Out    HM Colonoscopy   This patient has no relevant Health Maintenance data.    Medications: Current Outpatient Medications on File Prior to Visit  Medication Sig Dispense Refill   acetaminophen   (TYLENOL ) 325 MG tablet Take 2 tablets (650 mg total) by mouth every 6 (six) hours as needed for mild pain (pain score 1-3) or fever (for pain scale < 4). 30 tablet 0   ibuprofen  (ADVIL ) 600 MG tablet Take 1 tablet (600 mg total) by mouth every 6 (six) hours as needed for fever or mild pain (pain score 1-3). 30 tablet 0   Prenatal Vit-Fe Fumarate-FA (PRENATAL MULTIVITAMIN) TABS tablet Take 1 tablet by mouth daily at 12 noon.     benzocaine -Menthol  (DERMOPLAST) 20-0.5 % AERO Apply 1 Application topically as needed for irritation (perineal discomfort). (Patient not taking: Reported on 05/14/2024) 56 g 0   docusate sodium  (COLACE) 100 MG capsule Take 1 capsule (100 mg total) by mouth 2 (two) times daily. (Patient not taking: Reported on 04/16/2024) 60 capsule 0   ferrous sulfate  325 (65 FE) MG EC tablet Take 325 mg by mouth 3 (three) times daily with meals. (Patient not taking: Reported on 05/14/2024)     hydrocortisone  (ANUSOL -HC) 25 MG suppository Place 1 suppository (25 mg total) rectally 2 (two) times daily. (Patient not taking: Reported on 05/14/2024) 12 suppository 1   ondansetron  (ZOFRAN -ODT) 4 MG disintegrating tablet Allow 1-2 tablets to dissolve in your mouth every 8 hours as needed for nausea/vomiting (Patient not taking: Reported on 04/07/2024) 30 tablet 0   senna-docusate (SENOKOT-S) 8.6-50 MG tablet Take 2  tablets by mouth at bedtime as needed for mild constipation. (Patient not taking: Reported on 05/14/2024) 15 tablet 0   witch hazel-glycerin  (TUCKS) pad Apply 1 Application topically as needed for hemorrhoids (for pain). (Patient not taking: Reported on 05/14/2024) 40 each 0   No current facility-administered medications on file prior to visit.    Discussed the use of AI scribe software for clinical note transcription with the patient, who gave verbal consent to proceed.   Objective:   Vitals:   11/15/24 0953  BP: 116/72  Pulse: (!) 107  SpO2: 99%   Vitals:   11/15/24 0953  Weight: 254 lb  (115.2 kg)  Height: 5' 4 (1.626 m)   Body mass index is 43.6 kg/m.  General: Well Developed, well nourished, and in no acute distress.  Neuro: Alert and oriented x3, extra-ocular muscles intact, sensation grossly intact. Cranial nerves II through XII are grossly intact, motor, sensory, and coordinative functions are intact. HEENT: Normocephalic, atraumatic, neck supple, no masses, no lymphadenopathy, thyroid  nonenlarged. Oropharynx, nasopharynx, external ear canals are unremarkable. Skin: Warm and dry, no rashes noted.  Cardiac: Regular rate and rhythm, no murmurs rubs or gallops. No peripheral edema. Pulses symmetric. Respiratory: Clear to auscultation bilaterally. Speaking in full sentences.  Abdominal: Soft, nontender, nondistended, positive bowel sounds, no masses, no organomegaly. Musculoskeletal: Stable, and with full range of motion.  Impression and Recommendations:   History of Present Illness Caroline Mendoza is a 33 year old female who presents for a routine physical exam and follow-up on multiple health concerns.  Chest pain and cardiac concerns - Intermittent left-sided chest pain described as a 'pinch', occurring randomly - Pain not associated with physical activity or breastfeeding - No heart palpitations - Family history of heart disease; father had heart attack and stroke five years ago - Expresses concern about personal cardiac health  Epigastric pain - Episode of burning pain in the epigastric region two weeks ago - Pain lasted the entire day - No dietary triggers identified  Hemorrhoidal symptoms and perianal mass - Persistent hemorrhoids - GI specialist previously recommended removal without anesthesia, which was declined - Presence of a lump or cyst near the hemorrhoids, developed during pregnancy - Lump/cyst is non-tender  Bowel movement irregularity - Inconsistent bowel movements, sometimes not occurring daily - Manages bowel movements  with dietary fiber from oatmeal and occasional use of Miralax  Chronic right foot pain - Chronic right foot pain persisting for months - Pain occurs with certain movements - No recent injury, indicating a chronic issue rather than acute trauma  Sleep apnea evaluation - Had an appointment for sleep apnea evaluation but cancelled due to family emergency - Has not rescheduled the evaluation  Allergic symptoms and physical activity - No recent issues with allergies - Has not been on walks due to weather  Assessment and Plan General adult medical examination Routine follow-up for health maintenance. - Ordered blood work including cholesterol and glucose panels. - Scheduled follow-up appointment in two weeks to review lab results.  Right foot pain Chronic pain likely related to tendons around foot and ankle. - Ordered x-ray of the right foot. - Will review x-ray results at follow-up appointment.  Hemorrhoids with associated perianal lump and constipation Chronic hemorrhoids with perianal lump, likely external hemorrhoid. Constipation managed with oatmeal and Miralax. - Referred to GI for second opinion on hemorrhoid management. - Increase dietary fiber intake. - Consider daily Miralax regimen if fiber is insufficient.  Suspected sleep apnea Discussed potential benefits  of CPAP therapy if confirmed. - Encouraged rescheduling of sleep apnea evaluation. - Discussed potential CPAP therapy if sleep apnea is confirmed.  Allergic rhinitis - Consider intranasal steroid (Flonase) if symptoms worsen.  Epigastric pain, likely gastroesophageal reflux Intermittent burning pain likely due to gastroesophageal reflux. Symptoms not suggestive of cardiac origin. - Advised trial of antacid (Tums, Maalox) if pain recurs. - Monitor for exertional pain or other concerning symptoms.  The patient was counselled, risk factors were discussed, and anticipatory guidance given.  Problem List Items  Addressed This Visit     Allergic rhinitis   Healthcare maintenance - Primary   Relevant Orders   CBC   Comprehensive metabolic panel with GFR   Hemoglobin A1c   Lipid panel   TSH + free T4   VITAMIN D  25 Hydroxy (Vit-D Deficiency, Fractures)   Hyperlipidemia   Inappropriate sinus tachycardia   Internal and external bleeding hemorrhoids (Chronic)   Non-restorative sleep   Patellofemoral arthralgia of right knee   Other Visit Diagnoses       Foot and ankle pain       Relevant Orders   DG Foot Complete Right   DG Ankle Complete Right        Orders & Medications Medications: No orders of the defined types were placed in this encounter.  Orders Placed This Encounter  Procedures   DG Foot Complete Right   DG Ankle Complete Right   CBC   Comprehensive metabolic panel with GFR   Hemoglobin A1c   Lipid panel   TSH + free T4   VITAMIN D  25 Hydroxy (Vit-D Deficiency, Fractures)     No follow-ups on file.    Selinda JINNY Ku, MD, [facility]   Primary Care Sports Medicine Primary [facility] and [facility] at [facility]

## 2024-11-15 NOTE — Patient Instructions (Signed)
-   Obtain fasting labs with orders provided (can have water or black coffee but otherwise no food or drink x 8 hours before labs) - Review information provided - Attend eye doctor annually, dentist every 6 months, work towards or maintain 30 minutes of moderate intensity physical activity at least 5 days per week, and consume a balanced diet - Return in 1 year for physical - Contact us  for any questions between now and then   VISIT SUMMARY:  Today, you had a routine physical exam and follow-up for several health concerns. We discussed your chest pain, epigastric pain, hemorrhoids, bowel movement irregularity, chronic right foot pain, and potential sleep apnea. Blood work was ordered, and a follow-up appointment was scheduled in two weeks to review the results.  YOUR PLAN:  CHEST PAIN AND CARDIAC CONCERNS: You have been experiencing intermittent left-sided chest pain, described as a 'pinch', with no association to physical activity or breastfeeding. -We will monitor your symptoms and review your blood work results at the follow-up appointment.  EPIGASTRIC PAIN: You had an episode of burning pain in the upper stomach area two weeks ago, which lasted the entire day. -Try using an antacid like Tums or Maalox if the pain comes back. -Keep an eye out for pain during physical activity or other concerning symptoms.  HEMORRHOIDS WITH ASSOCIATED PERIANAL LUMP AND CONSTIPATION: You have chronic hemorrhoids and a non-tender lump near them, along with inconsistent bowel movements. -Increase your dietary fiber intake. -Consider using Miralax daily if fiber alone is not enough. -You have been referred to a GI specialist for a second opinion on managing your hemorrhoids.  CHRONIC RIGHT FOOT PAIN: You have been experiencing chronic pain in your right foot for several months, likely related to the tendons around your foot and ankle. -An x-ray of your right foot has been ordered. -We will review the x-ray  results at your follow-up appointment.  SUSPECTED SLEEP APNEA: You had to cancel your sleep apnea evaluation due to a family emergency. -Please reschedule your sleep apnea evaluation. -We discussed the potential benefits of CPAP therapy if sleep apnea is confirmed.  ALLERGIC RHINITIS: You have not had recent issues with allergies. -Consider using an intranasal steroid like Flonase if your symptoms worsen.  GENERAL HEALTH MAINTENANCE: Routine follow-up for health maintenance. -Blood work has been ordered, including cholesterol and glucose panels. -A follow-up appointment has been scheduled in two weeks to review your lab results.

## 2024-11-16 LAB — LIPID PANEL
Chol/HDL Ratio: 5.8 ratio — ABNORMAL HIGH (ref 0.0–4.4)
Cholesterol, Total: 305 mg/dL — ABNORMAL HIGH (ref 100–199)
HDL: 53 mg/dL (ref >39)
LDL Chol Calc (NIH): 232 mg/dL — ABNORMAL HIGH (ref 0–99)
Triglycerides: 115 mg/dL (ref 0–149)
VLDL Cholesterol Cal: 20 mg/dL (ref 5–40)

## 2024-11-16 LAB — COMPREHENSIVE METABOLIC PANEL WITH GFR
ALT: 19 IU/L (ref 0–32)
AST: 16 IU/L (ref 0–40)
Albumin: 4.4 g/dL (ref 3.9–4.9)
Alkaline Phosphatase: 90 IU/L (ref 41–116)
BUN/Creatinine Ratio: 21 (ref 9–23)
BUN: 15 mg/dL (ref 6–20)
Bilirubin Total: 0.8 mg/dL (ref 0.0–1.2)
CO2: 25 mmol/L (ref 20–29)
Calcium: 9.9 mg/dL (ref 8.7–10.2)
Chloride: 103 mmol/L (ref 96–106)
Creatinine, Ser: 0.7 mg/dL (ref 0.57–1.00)
Globulin, Total: 3.2 g/dL (ref 1.5–4.5)
Glucose: 97 mg/dL (ref 70–99)
Potassium: 4.9 mmol/L (ref 3.5–5.2)
Sodium: 141 mmol/L (ref 134–144)
Total Protein: 7.6 g/dL (ref 6.0–8.5)
eGFR: 119 mL/min/1.73 (ref >59)

## 2024-11-16 LAB — CBC
Hematocrit: 43.4 % (ref 34.0–46.6)
Hemoglobin: 14 g/dL (ref 11.1–15.9)
MCH: 27.9 pg (ref 26.6–33.0)
MCHC: 32.3 g/dL (ref 31.5–35.7)
MCV: 87 fL (ref 79–97)
Platelets: 348 x10E3/uL (ref 150–450)
RBC: 5.02 x10E6/uL (ref 3.77–5.28)
RDW: 13.3 % (ref 11.7–15.4)
WBC: 7.3 x10E3/uL (ref 3.4–10.8)

## 2024-11-16 LAB — TSH+FREE T4
Free T4: 0.91 ng/dL (ref 0.82–1.77)
TSH: 22.7 u[IU]/mL — ABNORMAL HIGH (ref 0.450–4.500)

## 2024-11-16 LAB — VITAMIN D 25 HYDROXY (VIT D DEFICIENCY, FRACTURES): Vit D, 25-Hydroxy: 30.6 ng/mL (ref 30.0–100.0)

## 2024-11-16 LAB — HEMOGLOBIN A1C
Est. average glucose Bld gHb Est-mCnc: 117 mg/dL
Hgb A1c MFr Bld: 5.7 % — ABNORMAL HIGH (ref 4.8–5.6)

## 2024-11-18 ENCOUNTER — Encounter: Payer: Self-pay | Admitting: Family Medicine

## 2024-11-18 ENCOUNTER — Ambulatory Visit: Payer: Self-pay | Admitting: Family Medicine

## 2024-11-18 DIAGNOSIS — R7989 Other specified abnormal findings of blood chemistry: Secondary | ICD-10-CM

## 2024-11-18 NOTE — Telephone Encounter (Signed)
 Please review and advise.  JM

## 2024-11-27 ENCOUNTER — Encounter: Payer: Self-pay | Admitting: Family Medicine

## 2024-11-27 ENCOUNTER — Ambulatory Visit: Admitting: Family Medicine

## 2024-11-27 ENCOUNTER — Encounter: Payer: Self-pay | Admitting: Sleep Medicine

## 2024-11-27 ENCOUNTER — Ambulatory Visit: Admitting: Sleep Medicine

## 2024-11-27 VITALS — BP 120/80 | HR 83 | Temp 98.7°F | Ht 64.0 in | Wt 257.8 lb

## 2024-11-27 VITALS — BP 102/64 | HR 91 | Ht 64.0 in | Wt 255.0 lb

## 2024-11-27 DIAGNOSIS — G4733 Obstructive sleep apnea (adult) (pediatric): Secondary | ICD-10-CM

## 2024-11-27 DIAGNOSIS — M76821 Posterior tibial tendinitis, right leg: Secondary | ICD-10-CM

## 2024-11-27 DIAGNOSIS — R0683 Snoring: Secondary | ICD-10-CM

## 2024-11-27 DIAGNOSIS — I10 Essential (primary) hypertension: Secondary | ICD-10-CM

## 2024-11-27 DIAGNOSIS — L309 Dermatitis, unspecified: Secondary | ICD-10-CM | POA: Diagnosis not present

## 2024-11-27 DIAGNOSIS — N62 Hypertrophy of breast: Secondary | ICD-10-CM | POA: Diagnosis not present

## 2024-11-27 DIAGNOSIS — L819 Disorder of pigmentation, unspecified: Secondary | ICD-10-CM

## 2024-11-27 DIAGNOSIS — L304 Erythema intertrigo: Secondary | ICD-10-CM | POA: Diagnosis not present

## 2024-11-27 DIAGNOSIS — M216X2 Other acquired deformities of left foot: Secondary | ICD-10-CM

## 2024-11-27 DIAGNOSIS — M216X1 Other acquired deformities of right foot: Secondary | ICD-10-CM | POA: Diagnosis not present

## 2024-11-27 DIAGNOSIS — K648 Other hemorrhoids: Secondary | ICD-10-CM | POA: Diagnosis not present

## 2024-11-27 DIAGNOSIS — K644 Residual hemorrhoidal skin tags: Secondary | ICD-10-CM

## 2024-11-27 DIAGNOSIS — G4710 Hypersomnia, unspecified: Secondary | ICD-10-CM | POA: Diagnosis not present

## 2024-11-27 DIAGNOSIS — G478 Other sleep disorders: Secondary | ICD-10-CM

## 2024-11-27 NOTE — Progress Notes (Signed)
 Name:Caroline Mendoza MRN: [id number] DOB: 1990-12-17   CHIEF COMPLAINT:  EXCESSIVE DAYTIME SLEEPINESS   HISTORY OF PRESENT ILLNESS: [name] is a 33 y.o. w/ a h/o morbid obesity who presents for c/o loud snoring, witnessed apnea and excessive daytime sleepiness which has been present for several years. Reports nocturnal awakenings due to nocturia, however does not have difficulty falling back to sleep. Reports significant weight changes. Admits to morning headaches, night sweats and dry mouth. Denies RLS symptoms, dream enactment, cataplexy, hypnagogic or hypnapompic hallucinations. Denies a family history of sleep apnea. Denies drowsy driving. Denies alcohol, tobacco or illicit drug use.   Bedtime 8-9 pm Sleep onset 10 mins Rise time 6-7 am   EPWORTH SLEEP SCORE 14    11/27/2024   10:00 AM  Results of the Epworth flowsheet  Sitting and reading 2  Watching TV 3  Sitting, inactive in a public place (e.g. a theatre or a meeting) 1  As a passenger in a car for an hour without a break 3  Lying down to rest in the afternoon when circumstances permit 3  Sitting and talking to someone 0  Sitting quietly after a lunch without alcohol 1  In a car, while stopped for a few minutes in traffic 1  Total score 14    PAST MEDICAL HISTORY :   has a past medical history of Hemorrhoids, History of postpartum hemorrhage (04/16/2024), and Pelvic pain in female (04/08/2021).  has a past surgical history that includes Wisdom tooth extraction and Colonoscopy with propofol  (N/A, 09/17/2021). Prior to Admission medications  Medication Sig Start Date End Date Taking? Authorizing Provider  acetaminophen  (TYLENOL ) 325 MG tablet Take 2 tablets (650 mg total) by mouth every 6 (six) hours as needed for mild pain (pain score 1-3) or fever (for pain scale < 4). 04/18/24   Tanda Edsel Fuller, CNM  cholecalciferol (VITAMIN D3) 25 MCG (1000 UNIT) tablet Take 1,000 Units by  mouth daily.    [provider]  ibuprofen  (ADVIL ) 600 MG tablet Take 1 tablet (600 mg total) by mouth every 6 (six) hours as needed for fever or mild pain (pain score 1-3). 04/18/24   Tanda Edsel Fuller, CNM  Prenatal Vit-Fe Fumarate-FA (PRENATAL MULTIVITAMIN) TABS tablet Take 1 tablet by mouth daily at 12 noon.    [provider]   Allergies[1]  FAMILY HISTORY:  family history includes Healthy in her brother, mother, sister, and sister; Heart attack in her father; Stroke in her father. SOCIAL HISTORY:  reports that she has never smoked. She has never used smokeless tobacco. She reports that she does not currently use alcohol. She reports that she does not use drugs.   Review of Systems:  Gen:  Denies  fever, sweats, chills weight loss  HEENT: Denies blurred vision, double vision, ear pain, eye pain, hearing loss, nose bleeds, sore throat Cardiac:  No dizziness, chest pain or heaviness, chest tightness,edema, No JVD Resp:   No cough, -sputum production, -shortness of breath,-wheezing, -hemoptysis,  Gi: Denies swallowing difficulty, stomach pain, nausea or vomiting, diarrhea, constipation, bowel incontinence Gu:  Denies bladder incontinence, burning urine Ext:   Denies Joint pain, stiffness or swelling Skin: Denies  skin rash, easy bruising or bleeding or hives Endoc:  Denies polyuria, polydipsia , polyphagia or weight change Psych:   Denies depression, insomnia or hallucinations  Other:  All other systems negative  VITAL SIGNS: BP 120/80   Pulse 83   Temp 98.7 F (  37.1 C)   Ht 5' 4 (1.626 m)   Wt 257 lb 12.8 oz (116.9 kg)   LMP  (LMP Unknown)   SpO2 98%   Breastfeeding Yes   BMI 44.25 kg/m    Physical Examination:   General Appearance: No distress  EYES PERRLA, EOM intact.   NECK Supple, No JVD Pulmonary: normal breath sounds, No wheezing.  CardiovascularNormal S1,S2.  No m/r/g.   Abdomen: Benign, Soft, non-tender. Skin:   warm, no rashes, no  ecchymosis  Extremities: normal, no cyanosis, clubbing. Neuro:without focal findings,  speech normal  PSYCHIATRIC: Mood, affect within normal limits.   ASSESSMENT AND PLAN  OSA I suspect that OSA is likely present due to clinical presentation. Discussed the consequences of untreated sleep apnea. Advised not to drive drowsy for safety of patient and others. Will complete further evaluation with a home sleep study and follow up to review results.    HTN Stable, on current management. Following with PCP.    MEDICATION ADJUSTMENTS/LABS AND TESTS ORDERED: Recommend Sleep Study   Patient  satisfied with Plan of action and management. All questions answered  Follow up to review HST results and treatment plan.   I spent a total of 32 minutes reviewing chart data, face-to-face evaluation with the patient, counseling and coordination of care as detailed above.    Dayvon Dax, M.D.  Sleep Medicine [facility]           [1] No Known Allergies

## 2024-11-27 NOTE — Patient Instructions (Signed)
 Caroline Mendoza

## 2024-11-28 NOTE — Telephone Encounter (Signed)
 Review and advise.  JM

## 2024-11-29 ENCOUNTER — Other Ambulatory Visit: Payer: Self-pay

## 2024-11-29 DIAGNOSIS — N62 Hypertrophy of breast: Secondary | ICD-10-CM

## 2024-12-04 MED ORDER — TRIAMCINOLONE ACETONIDE 0.1 % EX OINT: 1.0000 | TOPICAL_OINTMENT | Freq: Two times a day (BID) | CUTANEOUS | 0 refills | Status: DC

## 2024-12-04 MED ORDER — KETOCONAZOLE 2 % EX CREA: TOPICAL_CREAM | CUTANEOUS | 0 refills | Status: AC

## 2024-12-04 NOTE — Progress Notes (Signed)
 "    Primary Care / Sports Medicine Office Visit  Patient Information:  Patient ID: Caroline Mendoza, female DOB: 1991-08-29 Age: 33 y.o. MRN: [id number]   Caroline Mendoza is a pleasant 33 y.o. female presenting with the following:  Chief Complaint  Patient presents with   Headache    Headaches x 2 months comes and goes. Headaches come in the posterior aspect of skull. Patient feels that the pain is different from a regular headache. When she gets these specific headaches even the back of her head is tender to touch and it feels like a stabbing pain. She tries tylenol  to relieve the headache but it doesn't take the pain away just dulls.    Foot Pain    Right foot pain after patient gave birth. When patient stands for to long and when she places her foot on the side is when the pain comes. The pain comes on really strong and slowly fades away.     Vitals:   11/27/24 0841  BP: 102/64  Pulse: 91  SpO2: 97%   Vitals:   11/27/24 0841  Weight: 255 lb (115.7 kg)  Height: 5' 4 (1.626 m)   Body mass index is 43.77 kg/m.  DG Ankle Complete Right Result Date: 11/21/2024 CLINICAL DATA:  Acute on chronic inversion EXAM: RIGHT ANKLE - COMPLETE 3+ VIEW COMPARISON:  None Available. FINDINGS: There is no evidence of fracture, dislocation, or joint effusion. There is no evidence of arthropathy or other focal bone abnormality. Soft tissues are unremarkable. IMPRESSION: Negative. Electronically Signed   By: Luke Bun M.D.   On: 11/21/2024 20:51   DG Foot Complete Right Result Date: 11/21/2024 CLINICAL DATA:  Acute on chronic inversion EXAM: DG FOOT COMPLETE 3+V*R* COMPARISON:  None Available. FINDINGS: There is no evidence of fracture or dislocation. There is no evidence of arthropathy or other focal bone abnormality. Soft tissues are unremarkable. IMPRESSION: Negative. Electronically Signed   By: Luke Bun M.D.   On: 11/21/2024 20:51     Discussed the use of  AI scribe software for clinical note transcription with the patient, who gave verbal consent to proceed.   Independent interpretation of notes and tests performed by another provider:   None  Procedures performed:   None  Pertinent History, Exam, Impression, and Recommendations:   History of Present Illness Caroline Mendoza is a 33 year old female with recurrent right peroneal tendinitis who presents for evaluation of right lateral foot pain.  Right lateral foot pain and peroneal tendinitis - Recurrent pain localized to the lateral aspect of the right foot, specifically at the base of the fifth metatarsal - Current episode is more focal compared to prior episode in June 2022, which involved right lower leg pain radiating to the foot and ankle and resolved with rest and massage - No recent sports participation or acute injury - Ambulation and changes in activity exacerbate symptoms - No use of topical or oral anti-inflammatories prior to this visit - No lower extremity numbness or systemic symptoms  Symptoms related to macromastia - Chronic back pain and neck pain - Significant discomfort during sleep, requiring repositioning of breasts for comfort  Inframammary intertrigo - Frequent skin irritation beneath the breasts, characterized by pink, shiny, and pruritic skin bilaterally - No history of infection  Internal hemorrhoids with rectal bleeding - Intermittent rectal bleeding, most recently approximately 10 days ago - History of daily rectal bleeding while using daily Miralax, prompting reduction in frequency -  Currently manages constipation with intermittent Miralax and daily oatmeal, resulting in daily bowel movements - Persistent external lump  Headache and non-restorative sleep - Chronic headaches in the context of non-restorative sleep - Currently being evaluated by sleep medicine for possible sleep apnea - No sleep study performed yet; appointment scheduled  for today  Obesity and weight management - Expresses interest in both medical and surgical weight management options, including bariatric surgery consultation  Physical Exam RANGE OF MOTION: Full active and passive range of motion in the foot without pain or limitation. Normal strength and weight-bearing. Normal gait. Heel appears normal on x-ray. SKIN: Skin on hand slightly pink.  Results Radiology Right ankle and foot radiographs (11/27/2024): Normal bone architecture, no degenerative changes, no evidence of prior fracture, no osteophytes, preserved joint spaces, unremarkable base of the fifth metatarsal. (Independently interpreted)  Assessment and Plan Right peroneal tendinitis Recurrent tendinitis at the base of the fifth metatarsal since 2022, exacerbated by flat foot and pronation, increasing risk for tendon degeneration. - Prescribed topical diclofenac gel twice daily. - Prescribed celecoxib as needed for severe pain, to be used sparingly and with food. - Referred to podiatry for evaluation and custom orthotics fitting. - Recommended bilateral ankle and foot strengthening exercises at home. - Discussed possible future local injection if pain persists.  Obesity Obesity with motivation for sustainable weight loss, open to medical and surgical interventions. - Referred to bariatric surgery for consultation regarding surgical weight loss options. - Discussed potential for weight loss medication pending sleep study results and insurance coverage.  Suspected sleep apnea Suspected due to non-restorative sleep and headaches, pending sleep study and evaluation. - Advised to attend scheduled sleep medicine appointment and complete sleep study.  Headache, likely secondary to sleep disturbance Recurrent headaches likely secondary to sleep disturbance and possible sleep apnea, with suspected sleep-related hypoxia. - Provided Nurtec samples for acute headache management as needed. -  Instructed to message if Nurtec is effective to arrange prescription. - Advised to follow up if headaches persist or if Nurtec is ineffective.  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 - Referred to plastic surgery for evaluation for breast reduction surgery.  Intertrigo, inframammary Recurrent inframammary intertrigo with current pink, shiny skin under both breasts, at risk for fungal superinfection. - Prescribed topical antifungal for inframammary area. - Advised use of hypoallergenic moisturizers for skin irritation. - Instructed to monitor for signs of infection and to message if symptoms worsen or if over-the-counter measures are ineffective.  Eczema, hand Recurrent hand eczema, flaring seasonally, currently affecting primarily one hand. - Prescribed topical steroid cream for hand eczema. - Referred to dermatology for further evaluation, including assessment of facial hyperpigmentation.  Facial hyperpigmentation Facial hyperpigmentation. - Referred to dermatology for evaluation and management.  Internal hemorrhoids with intermittent bleeding Internal hemorrhoids with intermittent bleeding, improved with current bowel regimen, ongoing risk for recurrence. - Advised to maintain current bowel regimen with daily oatmeal and intermittent polyethylene glycol. - Provided GI scheduling contact information and instructed to call for follow-up appointment as prior referral was not completed. - Advised to keep GI appointment even if asymptomatic, as definitive treatment options may be discussed.  Problem List Items Addressed This Visit     Chronic eczema of hand   Relevant Medications   triamcinolone  ointment (KENALOG ) 0.1 %   Dermatitis   Relevant Medications   ketoconazole  (NIZORAL ) 2 % cream   Internal and external bleeding  hemorrhoids (Chronic)    Non-restorative sleep   Posterior tibial tendinitis, right   Relevant Orders   Ambulatory referral to Podiatry   Symptomatic mammary hypertrophy - Primary   Relevant Orders   Ambulatory referral to Plastic Surgery   Other Visit Diagnoses       Pronation of both feet       Relevant Orders   Ambulatory referral to Podiatry      A total of 40 minutes was spent on the date of service, 12/04/2024, encompassing both face-to-face and non-face-to-face time. This included review of prior records and imaging (e.g., MRI and/or radiographs), medical chart review, information gathering, documentation, care coordination with clinic staff, discussion and counseling with the patient regarding clinical findings and treatment options, and planning for follow-up and next steps in management.   Orders & Medications Medications:  Meds ordered this encounter  Medications   ketoconazole  (NIZORAL ) 2 % cream    Sig: Apply a thin layer to the affected skin under the breasts once daily for 2 weeks. Do not apply to the nipple or areola. Wash hands after use. Keep area clean and dry.    Dispense:  60 g    Refill:  0   triamcinolone  ointment (KENALOG ) 0.1 %    Sig: Apply 1 Application topically 2 (two) times daily. To affected areas    Dispense:  60 g    Refill:  0   Orders Placed This Encounter  Procedures   Ambulatory referral to Plastic Surgery   Ambulatory referral to Podiatry     No follow-ups on file.     Selinda JINNY Ku, MD, [facility]   Primary Care Sports Medicine Primary Care and Sports Medicine at [facility]   "

## 2024-12-04 NOTE — Patient Instructions (Addendum)
 VISIT SUMMARY:  During your visit, we addressed your right lateral foot pain due to recurrent peroneal tendinitis, obesity and weight management, suspected sleep apnea, headaches, macromastia, inframammary intertrigo, hand eczema, facial hyperpigmentation, and internal hemorrhoids with intermittent bleeding.  YOUR PLAN:  RIGHT PERONEAL TENDINITIS: Recurrent tendinitis at the base of the fifth metatarsal, worsened by flat foot and pronation. -Use topical diclofenac gel twice daily. -Take celecoxib as needed for severe pain, with food. -You are referred to podiatry for evaluation and custom orthotics fitting. -Perform bilateral ankle and foot strengthening exercises at home. -Consider a local injection if pain persists.  OBESITY: You are motivated for sustainable weight loss and open to medical and surgical interventions. -You are referred to bariatric surgery for consultation regarding surgical weight loss options. -We discussed potential for weight loss medication pending sleep study results and insurance coverage.  SUSPECTED SLEEP APNEA: Suspected due to non-restorative sleep and headaches. -Attend your scheduled sleep medicine appointment and complete the sleep study.  HEADACHE, LIKELY SECONDARY TO SLEEP DISTURBANCE: Recurrent headaches likely due to sleep disturbance and possible sleep apnea. -Use Nurtec samples for acute headache management as needed. -Message us  if Nurtec is effective to arrange a prescription. -Follow up if headaches persist or if Nurtec is ineffective.  MACROMASTIA WITH ASSOCIATED BACK PAIN AND SKIN IRRITATION: Macromastia causing significant back pain, sleep disturbance, and skin irritation. -You are referred to plastic surgery for evaluation for breast reduction surgery.  INTERTRIGO, INFRAMAMMARY: Recurrent skin irritation under the breasts, at risk for fungal superinfection. -Use the prescribed topical antifungal for the inframammary area. -Use hypoallergenic  moisturizers for skin irritation. -Monitor for signs of infection and message us  if symptoms worsen or if over-the-counter measures are ineffective.  ECZEMA, HAND: Recurrent hand eczema, currently affecting primarily one hand. -Use the prescribed topical steroid cream for hand eczema. -You are referred to dermatology for further evaluation, including assessment of facial hyperpigmentation.  FACIAL HYPERPIGMENTATION: Facial hyperpigmentation. -You are referred to dermatology for evaluation and management.  INTERNAL HEMORRHOIDS WITH INTERMITTENT BLEEDING: Internal hemorrhoids with intermittent bleeding, improved with current bowel regimen. -Maintain your current bowel regimen with daily oatmeal and intermittent polyethylene glycol. -Call GI scheduling for a follow-up appointment as prior referral was not completed. -Keep your GI appointment even if asymptomatic, as definitive treatment options may be discussed.

## 2024-12-13 ENCOUNTER — Encounter

## 2024-12-13 DIAGNOSIS — G4733 Obstructive sleep apnea (adult) (pediatric): Secondary | ICD-10-CM

## 2024-12-20 ENCOUNTER — Encounter: Payer: Self-pay | Admitting: Family Medicine

## 2024-12-20 ENCOUNTER — Other Ambulatory Visit: Payer: Self-pay | Admitting: Family Medicine

## 2024-12-20 ENCOUNTER — Encounter: Payer: Self-pay | Admitting: Podiatry

## 2024-12-20 ENCOUNTER — Ambulatory Visit: Admitting: Podiatry

## 2024-12-20 VITALS — Ht 64.0 in | Wt 257.8 lb

## 2024-12-20 DIAGNOSIS — M65971 Unspecified synovitis and tenosynovitis, right ankle and foot: Secondary | ICD-10-CM

## 2024-12-20 MED ORDER — MELOXICAM 15 MG PO TABS: 15.0000 mg | ORAL_TABLET | Freq: Every day | ORAL | 1 refills | Status: DC

## 2024-12-20 MED ORDER — BETAMETHASONE SOD PHOS & ACET 6 (3-3) MG/ML IJ SUSP
3.0000 mg | Freq: Once | INTRAMUSCULAR | Status: AC
  Administered 2024-12-20: 3 mg via INTRA_ARTICULAR

## 2024-12-20 MED ORDER — METHYLPREDNISOLONE 4 MG PO TBPK: ORAL_TABLET | ORAL | 0 refills | Status: DC

## 2024-12-20 NOTE — Telephone Encounter (Signed)
 Please review.  KP

## 2024-12-20 NOTE — Progress Notes (Signed)
" ° °  Chief Complaint  Patient presents with   Foot Pain    Pt is here due to right foot pain states the pain began in May, and for the last few months pain has been constant, seen her PCP in December for this issue had x-rays done and was told that nothing was wrong  with the x-rays, states sometimes its hard to put pressure onto the foot due to pain.    HPI: 34 y.o. femalepresenting for evaluation of pain and tenderness associated to the right midfoot ongoing since May 2025. At that time she states that she would rest her foot in a certain position while breast-feeding and that originally initiated the pain.  Since that time and is exacerbated and is now painful and tender on a daily basis.  No history of trauma.  X-rays taken 11/15/2024 negative for any pathology  Past Medical History:  Diagnosis Date   Hemorrhoids    History of postpartum hemorrhage 04/16/2024   From obstetrical laceration      Pelvic pain in female 04/08/2021    Past Surgical History:  Procedure Laterality Date   COLONOSCOPY WITH PROPOFOL  N/A 09/17/2021   Procedure: COLONOSCOPY WITH PROPOFOL ;  Surgeon: Janalyn Keene NOVAK, MD;  Location: [facility] ENDOSCOPY;  Service: Endoscopy;  Laterality: N/A;   WISDOM TOOTH EXTRACTION      Allergies[1]   Physical Exam: General: The patient is alert and oriented x3 in no acute distress.  Dermatology: Skin is warm, dry and supple bilateral lower extremities.   Vascular: Palpable pedal pulses bilaterally. Capillary refill within normal limits.  No appreciable edema.  No erythema.  Neurological: Grossly intact via light touch  Musculoskeletal Exam: No pedal deformities noted.  Significant tenderness with palpation around the fourth TMT of the right foot  DG Ankle Complete Right (Accession [id number]) (Order [id number]) Imaging Date: 11/15/2024 Department: [facility]  FINDINGS: There is no evidence of fracture, dislocation, or joint effusion. There is  no evidence of arthropathy or other focal bone abnormality. Soft tissues are unremarkable. IMPRESSION: Negative.  DG Foot Complete Right (Accession [id number]) (Order [id number]) Imaging Date: 11/15/2024 Department: [facility]  FINDINGS: There is no evidence of fracture or dislocation. There is no evidence of arthropathy or other focal bone abnormality. Soft tissues are unremarkable. IMPRESSION: Negative.  Assessment/Plan of Care: 1.  Capsulitis/synovitis localized around the fourth TMT right midfoot  - Patient evaluated.  X-rays reviewed -Injection 0.0 complexity Celestone  Soluspan injected around the fourth TMT right foot -Medrol  Dosepak -Meloxicam  15 mg daily after completion of Dosepak -Dispensed.  WBAT x 4 weeks -Return to clinic 4 weeks, if there is no improvement we may need to proceed with MRI of the right  *Works from home.  No longer breast-feeding     Thresa EMERSON Sar, DPM [facility]  Dr. Thresa EMERSON Sar, DPM    [address] [place], [place] [address]                Office [phone]  Fax [phone]        [1] No Known Allergies  "

## 2024-12-27 DIAGNOSIS — G4733 Obstructive sleep apnea (adult) (pediatric): Secondary | ICD-10-CM | POA: Diagnosis not present

## 2024-12-31 ENCOUNTER — Encounter: Payer: Self-pay | Admitting: Sleep Medicine

## 2024-12-31 DIAGNOSIS — G4733 Obstructive sleep apnea (adult) (pediatric): Secondary | ICD-10-CM

## 2025-01-01 ENCOUNTER — Ambulatory Visit

## 2025-01-01 DIAGNOSIS — B079 Viral wart, unspecified: Secondary | ICD-10-CM

## 2025-01-01 DIAGNOSIS — L309 Dermatitis, unspecified: Secondary | ICD-10-CM | POA: Diagnosis not present

## 2025-01-01 MED ORDER — CLOBETASOL PROPIONATE 0.05 % EX OINT: TOPICAL_OINTMENT | CUTANEOUS | 5 refills | Status: AC

## 2025-01-01 NOTE — Progress Notes (Signed)
 "   Subjective   Caroline Mendoza is a 34 y.o. female who presents for the following: eczema. Patient is new patient  Today patient reports: Patient was referred for eczema, has been using triamcinolone  ointment prescribed by PCP and still gets flares.  Flares on hands only. Mostly occurs during winter.   Concerns of a spot on her right cheek, recently appeared.  Review of Systems:    No other skin or systemic complaints except as noted in HPI or Assessment and Plan.  The following portions of the chart were reviewed this encounter and updated as appropriate: medications, allergies, medical history  Relevant Medical History:  n/a   Objective  (SKPE) Well appearing patient in no apparent distress; mood and affect are within normal limits. Examination was performed of the: Focused Exam of: face, hands   Examination notable for: flat topped tan verrucous papules of R cheek   - Hands with xerosis and lichenified plaques, fissuring of webspaces and scale  Examination limited by: Undergarments, Shoes or socks , and Clothing   Right cheek x5 (5) Tanned flat papule on the right cheek  Assessment & Plan  (SKAP)   Hand dermatitis - moderate Chronic and persistent condition with duration or expected duration over one year. Condition is symptomatic and bothersome to patient. Patient is flaring and not currently at treatment goal.  - Diagnosis, treatment options, prognosis, risk/ benefit, and side effects of treatment were discussed with the patient.  - Start clobetasol  ointment 0.05% twice daily to affected skin Discussed side effect of super potent topical steroids including atrophy, dyspigmentation, striae, telangectasia, folliculitis, loss of skin pigment, hair growth, tachyphylaxis, risk of systemic absorption with missuse. - Moisturization with with thick ointment like vaseline, use cotton gloves to sleep in at night - Wash hands in lukewarm water - Use cotton gloves under  rubber gloves when using cleaning products - Recommend vaseline for hands and clobetasol  only   Favor verruca of R cheek - vs SK vs other  - Discussed diagnosis, typical course, and treatment options for this condition - Patient opted for cryotherapy treatment at today's visit - procedure note below  - Discussed if does not improve at next visit will bx    Was sun protection counseling provided?: No   Chronic and persistent condition with duration or expected duration over one year. Condition is symptomatic and bothersome to patient. Patient is flaring and not currently at treatment goal.   Patient instructions (SKPI)   Procedures, orders, diagnosis for this visit:  VIRAL WARTS, UNSPECIFIED TYPE (5) Right cheek x5 (5) - Destruction of lesion - Right cheek x5 (5) Complexity: simple   Destruction method: cryotherapy   Informed consent: discussed and consent obtained   Timeout:  patient name, date of birth, surgical site, and procedure verified Lesion destroyed using liquid nitrogen: Yes   Region frozen until ice ball extended beyond lesion: Yes   Cryo cycles: 1 or 2. Outcome: patient tolerated procedure well with no complications   Post-procedure details: wound care instructions given   Additional details:  Prior to procedure, discussed risks of blister formation, small wound, skin dyspigmentation, or rare scar following cryotherapy. Recommend Vaseline ointment to treated areas while healing.    Viral warts, unspecified type -     Destruction of lesion  Other orders -     Clobetasol  Propionate; Apply 1 gram topically to affected area of skin twice daily. Stop once resolved and restart as needed for flares. Avoid use on face,  armpits, groin unless otherwise indicated.  Dispense: 60 g; Refill: 5    Return to clinic: Return in about 6 weeks (around 02/12/2025) for hand dermatitis f/u , w/ Dr. Raymund.  I, Almetta Nora, RMA, am acting as scribe for Lauraine JAYSON Raymund, MD  .   Documentation: I have reviewed the above documentation for accuracy and completeness, and I agree with the above.  Lauraine JAYSON Raymund, MD  "

## 2025-01-01 NOTE — Patient Instructions (Addendum)
 Cryosurgery  Cryosurgery (freezing) uses liquid nitrogen to destroy certain types of skin lesions. Lowering the temperature of the lesion in a small area surrounding skin destroys the lesion. Immediately following cryosurgery, you will notice redness and swelling of the treatment area. Blistering or weeping may occur, lasting approximately one week which will then be followed by crusting. Most areas will heal completely in 10 to 14 days.  Wash the treated areas daily. Allow soap and water to run over the areas, but do not scrub. Should a scab or crust form, allow it to fall off on its own. Do not remove or pick at it. Application of an ointment  and a bandage may make you feel more comfortable, but it is not necessary. Some people develop an allergy to Neosporin, so we recommend that Vaseline or  Aquaphor be used.  The cryotherapy site will be more sensitive than your surrounding skin. Keep it covered, and remember to apply sunscreen every day to all your sun exposed skin. A scar may remain which is lighter or pinker than your normal skin. Your body will continue to improve your scar for up to one year; however a light-colored scar may remain.  Infection following cryotherapy is rare. However if you are worried about the appearance of the treated area, contact your doctor. We have a physician on call at all times. If you have any concerns about the site, please call our clinic at [phone]  Steroid Use  We prescribed you a topical steroid at today's visit.   General application instructions: -Apply this to any affected skin areas, twice (2 times) daily, for two (2) weeks -If the areas are better, you can stop -Re-start the topical steroid if the areas come back, or flare -If the areas don't get better after two weeks, we sometimes recommend taking a break for one (1) week, before restarting for another two (2) weeks. Repeat as needed  The most common side effects of topical steroid  medications include changes in skin pigment and thinning of the skin. If the steroid is only applied to affected areas of the skin, these effects rarely occur unless the steroid is used for a very long time (years without stopping).   If we prescribed you a strong steroid, please avoid applying to face, groin, or neck, unless we tell you otherwise. We will include more detail in your prescription instructions.   Hand Hygiene Recommendations - Avoid overwashing your hands with harsh soap and water. Try to use Dove, Oil of Olay, or Cerave soap if you can, and after each handwashing, try to moisturize with something greasy. Recommendations include: --  Neutrogena Norwegian Formula Hand cream -- Eucerin Hand cream --  Vaseline or Aquaphor -- Aveeno Skin Healing Ointment -- Janeen Working Hands -- La Roche Posay Hand Cream  - At night, after applying the moisturizer, put on cotton gloves (available at [employer], 2 for $5). - Avoid using gel hand sanitizer if possible - Avoid hand contact with raw fruits as much as possible - Use cotton gloves under rubber gloves for all wetwork - Avoid excessive manipulation of cuticles - do not overcut.    Due to recent changes in healthcare laws, you may see results of your pathology and/or laboratory studies on MyChart before the doctors have had a chance to review them. We understand that in some cases there may be results that are confusing or concerning to you. Please understand that not all results are received at the same time  and often the doctors may need to interpret multiple results in order to provide you with the best plan of care or course of treatment. Therefore, we ask that you please give us  2 business days to thoroughly review all your results before contacting the office for clarification. Should we see a critical lab result, you will be contacted sooner.   If You Need Anything After Your Visit  If you have any questions or concerns  for your doctor, please call our main line at [phone] and press option 4 to reach your doctor's medical assistant. If no one answers, please leave a voicemail as directed and we will return your call as soon as possible. Messages left after 4 pm will be answered the following business day.   You may also send us  a message via MyChart. We typically respond to MyChart messages within 1-2 business days.  For prescription refills, please ask your pharmacy to contact our office. Our fax number is [phone].  If you have an urgent issue when the clinic is closed that cannot wait until the next business day, you can page your doctor at the number below.    Please note that while we do our best to be available for urgent issues outside of office hours, we are not available 24/7.   If you have an urgent issue and are unable to reach us , you may choose to seek medical care at your doctor's office, retail clinic, urgent care center, or emergency room.  If you have a medical emergency, please immediately call 911 or go to the emergency department.  Pager Numbers  - Dr. Hester: [phone]  - Dr. Jackquline: [phone]  - Dr. Claudene: [phone]   - Dr. Raymund: [phone]  In the event of inclement weather, please call our main line at [phone] for an update on the status of any delays or closures.  Dermatology Medication Tips: Please keep the boxes that topical medications come in in order to help keep track of the instructions about where and how to use these. Pharmacies typically print the medication instructions only on the boxes and not directly on the medication tubes.   If your medication is too expensive, please contact our office at [phone] option 4 or send us  a message through MyChart.   We are unable to tell what your co-pay for medications will be in advance as this is different depending on your insurance coverage. However, we may be able to find a substitute  medication at lower cost or fill out paperwork to get insurance to cover a needed medication.   If a prior authorization is required to get your medication covered by your insurance company, please allow us  1-2 business days to complete this process.  Drug prices often vary depending on where the prescription is filled and some pharmacies may offer cheaper prices.  The website www.goodrx.com contains coupons for medications through different pharmacies. The prices here do not account for what the cost may be with help from insurance (it may be cheaper with your insurance), but the website can give you the price if you did not use any insurance.  - You can print the associated coupon and take it with your prescription to the pharmacy.  - You may also stop by our office during regular business hours and pick up a GoodRx coupon card.  - If you need your prescription sent electronically to a different pharmacy, notify our office through [facility] or by phone at [phone] option  4.     Si Usted Necesita Algo Despus de Su Visita  Tambin puede enviarnos un mensaje a travs de Clinical Cytogeneticist. Por lo general respondemos a los mensajes de MyChart en el transcurso de 1 a 2 das hbiles.  Para renovar recetas, por favor pida a su farmacia que se ponga en contacto con nuestra oficina. Randi lakes de fax es [place] [phone].  Si tiene un asunto urgente cuando la clnica est cerrada y que no puede esperar hasta el siguiente da hbil, puede llamar/localizar a su doctor(a) al nmero que aparece a continuacin.   Por favor, tenga en cuenta que aunque hacemos todo lo posible para estar disponibles para asuntos urgentes fuera del horario de [place], no estamos disponibles las 24 horas del da, los 7 [address]  [address] de la [place].   Si tiene un problema urgente y no puede comunicarse con nosotros, puede optar por buscar atencin mdica  en el consultorio de su doctor(a), en una clnica privada, en un centro de  atencin urgente o en una sala de emergencias.  Si tiene engineer, drilling, por favor llame inmediatamente al 911 o vaya a la sala de emergencias.  Nmeros de bper  - Dr. Hester: [phone]  - Dra. Jackquline: [phone]  - Dr. Claudene: [phone]  - Dra. Kitts: [phone]  En caso de inclemencias del [place], por favor llame a nuestra lnea principal al [phone] para una actualizacin sobre el estado de cualquier retraso o cierre.  Consejos para la medicacin en dermatologa: Por favor, guarde las cajas en las que vienen los medicamentos de uso tpico para ayudarle a seguir las instrucciones sobre dnde y cmo usarlos. Las farmacias generalmente imprimen las instrucciones del medicamento slo en las cajas y no directamente en los tubos del [place].   Si su medicamento es muy caro, por favor, pngase en contacto con landry rieger llamando al [phone] y presione la opcin 4 o envenos un mensaje a travs de Clinical Cytogeneticist.   No podemos decirle cul ser su copago por los medicamentos por adelantado ya que esto es diferente dependiendo de la cobertura de su seguro. Sin embargo, es posible que podamos encontrar un medicamento sustituto a audiological scientist un formulario para que el seguro cubra el medicamento que se considera necesario.   Si se requiere una autorizacin previa para que su compaa de seguros cubra su medicamento, por favor permtanos de 1 a 2 das hbiles para completar este proceso.  Los precios de los medicamentos varan con frecuencia dependiendo del environmental consultant de dnde se surte la receta y alguna farmacias pueden ofrecer precios ms baratos.  El sitio web www.goodrx.com tiene cupones para medicamentos de health and safety inspector. Los precios aqu no tienen en cuenta lo que podra costar con la ayuda del seguro (puede ser ms barato con su seguro), pero el sitio web puede darle el precio si no utiliz tourist information centre manager.  - Puede imprimir el cupn correspondiente y  llevarlo con su receta a la farmacia.  - Tambin puede pasar por nuestra oficina durante el horario de atencin regular y education officer, museum una tarjeta de cupones de GoodRx.  - Si necesita que su receta se enve electrnicamente a una farmacia diferente, informe a nuestra oficina a travs de MyChart de [place] o por telfono llamando al [phone] y presione la opcin 4.

## 2025-01-09 NOTE — Telephone Encounter (Signed)
 Please review , paper work is in to be signed paperwork.  JM

## 2025-01-10 ENCOUNTER — Ambulatory Visit

## 2025-01-10 VITALS — BP 123/82 | HR 98 | Ht 64.0 in | Wt 261.2 lb

## 2025-01-10 DIAGNOSIS — M545 Low back pain, unspecified: Secondary | ICD-10-CM

## 2025-01-10 DIAGNOSIS — M542 Cervicalgia: Secondary | ICD-10-CM

## 2025-01-10 DIAGNOSIS — R21 Rash and other nonspecific skin eruption: Secondary | ICD-10-CM | POA: Diagnosis not present

## 2025-01-10 DIAGNOSIS — Z6841 Body Mass Index (BMI) 40.0 and over, adult: Secondary | ICD-10-CM

## 2025-01-10 DIAGNOSIS — M546 Pain in thoracic spine: Secondary | ICD-10-CM

## 2025-01-10 DIAGNOSIS — N62 Hypertrophy of breast: Secondary | ICD-10-CM

## 2025-01-10 MED ORDER — ZEPBOUND 2.5 MG/0.5ML ~~LOC~~ SOAJ: 2.5000 mg | SUBCUTANEOUS | 0 refills | Status: AC

## 2025-01-10 NOTE — Addendum Note (Signed)
 Addended by: Latrese Carolan J on: 01/10/2025 02:15 PM   Modules accepted: Orders

## 2025-01-10 NOTE — Addendum Note (Signed)
 Addended by: Euel Castile J on: 01/10/2025 02:24 PM   Modules accepted: Orders

## 2025-01-14 ENCOUNTER — Emergency Department
Admission: EM | Admit: 2025-01-14 | Discharge: 2025-01-14 | Disposition: A | Discharge status: Home / Self Care | Attending: Emergency Medicine | Admitting: Emergency Medicine

## 2025-01-14 ENCOUNTER — Ambulatory Visit: Admitting: Gastroenterology

## 2025-01-14 ENCOUNTER — Other Ambulatory Visit: Payer: Self-pay

## 2025-01-14 ENCOUNTER — Emergency Department

## 2025-01-14 DIAGNOSIS — R102 Pelvic and perineal pain unspecified side: Secondary | ICD-10-CM | POA: Insufficient documentation

## 2025-01-14 DIAGNOSIS — N939 Abnormal uterine and vaginal bleeding, unspecified: Secondary | ICD-10-CM | POA: Insufficient documentation

## 2025-01-14 DIAGNOSIS — Y762 Prosthetic and other implants, materials and accessory obstetric and gynecological devices associated with adverse incidents: Secondary | ICD-10-CM | POA: Insufficient documentation

## 2025-01-14 DIAGNOSIS — R109 Unspecified abdominal pain: Secondary | ICD-10-CM | POA: Insufficient documentation

## 2025-01-14 DIAGNOSIS — T8332XA Displacement of intrauterine contraceptive device, initial encounter: Secondary | ICD-10-CM | POA: Insufficient documentation

## 2025-01-14 LAB — URINALYSIS, W/ REFLEX TO CULTURE (INFECTION SUSPECTED): RBC / HPF: >50 RBC/hpf (ref 0–5)

## 2025-01-14 LAB — BASIC METABOLIC PANEL WITH GFR
Anion gap: 10 (ref 5–15)
BUN: 12 mg/dL (ref 6–20)
CO2: 28 mmol/L (ref 22–32)
Calcium: 9.5 mg/dL (ref 8.9–10.3)
Chloride: 105 mmol/L (ref 98–111)
Creatinine, Ser: 0.63 mg/dL (ref 0.44–1.00)
GFR, Estimated: >60 mL/min
Glucose, Bld: 128 mg/dL — ABNORMAL HIGH (ref 70–99)
Potassium: 4.4 mmol/L (ref 3.5–5.1)
Sodium: 143 mmol/L (ref 135–145)

## 2025-01-14 LAB — CBC
HCT: 42 % (ref 36.0–46.0)
Hemoglobin: 13.7 g/dL (ref 12.0–15.0)
MCH: 28.1 pg (ref 26.0–34.0)
MCHC: 32.6 g/dL (ref 30.0–36.0)
MCV: 86.1 fL (ref 80.0–100.0)
Platelets: 315 10*3/uL (ref 150–400)
RBC: 4.88 MIL/uL (ref 3.87–5.11)
RDW: 12.8 % (ref 11.5–15.5)
WBC: 7.3 10*3/uL (ref 4.0–10.5)
nRBC: 0 % (ref 0.0–0.2)

## 2025-01-14 LAB — TYPE AND SCREEN
ABO/RH(D): AB POS
Antibody Screen: NEGATIVE

## 2025-01-14 LAB — POC URINE PREG, ED: Preg Test, Ur: NEGATIVE

## 2025-01-14 MED ORDER — IBUPROFEN 600 MG PO TABS
600.0000 mg | ORAL_TABLET | Freq: Once | ORAL | Status: AC
  Administered 2025-01-14: 600 mg via ORAL
  Filled 2025-01-14: qty 1

## 2025-01-14 NOTE — ED Notes (Signed)
 Pt advised she did not want her vitals reassessed.

## 2025-01-14 NOTE — ED Triage Notes (Signed)
 Pt arrives via POV with c/o pelvic pain about a month ago that has been intermittent but has gotten more constant over the last few days. Pt had an IUD approx July of 2025. Pt states that they have been spotting for the last month, but over the last week the bleeding has become heavier. When they woke up this morning and used the bathroom, pt stated it was like their water broke but it was all blood in the toilet. PT states that the pelvic pain is between the leg and the pelvis on the right side. Pt is A&Ox4 and ambulatory during triage.

## 2025-01-16 ENCOUNTER — Encounter (INDEPENDENT_AMBULATORY_CARE_PROVIDER_SITE_OTHER): Payer: Self-pay

## 2025-01-17 ENCOUNTER — Encounter: Payer: Self-pay | Admitting: Podiatry

## 2025-01-17 ENCOUNTER — Ambulatory Visit: Admitting: Podiatry

## 2025-01-17 VITALS — Ht 64.0 in | Wt 260.0 lb

## 2025-01-17 DIAGNOSIS — M65971 Unspecified synovitis and tenosynovitis, right ankle and foot: Secondary | ICD-10-CM

## 2025-01-17 NOTE — Progress Notes (Signed)
" ° °  Chief Complaint  Patient presents with   Foot Pain    Pt is here to f/u on right foot, she states the pain is no longer there, feeling much better, has no other complaints.    HPI: 34 y.o. femalepresenting for evaluation of pain and tenderness associated to the right midfoot.  Overall patient states that she only has any pain or tenderness.  She only wore the cam boot for about 2-3 days.  She only took the meloxicam  for about 1 week.  Currently wearing tennis shoes and she is very satisfied with no pain   Brief history: Ongoing since May 2025. At that time she states that she would rest her foot in a certain position while breast-feeding and that originally initiated the pain.  No history of trauma  Past Medical History:  Diagnosis Date   Hemorrhoids    History of postpartum hemorrhage 04/16/2024   From obstetrical laceration      Pelvic pain in female 04/08/2021    Past Surgical History:  Procedure Laterality Date   COLONOSCOPY WITH PROPOFOL  N/A 09/17/2021   Procedure: COLONOSCOPY WITH PROPOFOL ;  Surgeon: Janalyn Keene NOVAK, MD;  Location: [facility] ENDOSCOPY;  Service: Endoscopy;  Laterality: N/A;   WISDOM TOOTH EXTRACTION      Allergies[1]   Physical Exam: General: The patient is alert and oriented x3 in no acute distress.  Dermatology: Skin is warm, dry and supple bilateral lower extremities.   Vascular: Palpable pedal pulses bilaterally. Capillary refill within normal limits.  No appreciable edema.  No erythema.  Neurological: Grossly intact via light touch  Musculoskeletal Exam: No pedal deformities noted.  Today there is no tenderness with palpation or range of motion of the fourth TMT of the right foot.  Resolved  DG Ankle Complete Right (Accession [id number]) (Order [id number]) Imaging Date: 11/15/2024 Department: [facility]  FINDINGS: There is no evidence of fracture, dislocation, or joint effusion. There is no evidence of arthropathy  or other focal bone abnormality. Soft tissues are unremarkable. IMPRESSION: Negative.  DG Foot Complete Right (Accession [id number]) (Order [id number]) Imaging Date: 11/15/2024 Department: [facility]  FINDINGS: There is no evidence of fracture or dislocation. There is no evidence of arthropathy or other focal bone abnormality. Soft tissues are unremarkable. IMPRESSION: Negative.  Assessment/Plan of Care: 1.  Capsulitis/synovitis localized around the fourth TMT right midfoot  - Patient evaluated.   -Overall significant improvement.  She no longer has any pain or tenderness associated to the foot with weightbearing -Recommend good supportive tennis shoes and sneakers -Recommend meloxicam  15 mg PRN -Return to clinic PRN  *Works from home.  No longer breast-feeding     Thresa EMERSON Sar, DPM [facility]  Dr. Thresa EMERSON Sar, DPM    [address] [place], [place] [address]                Office [phone]  Fax [phone]        [1] No Known Allergies  "

## 2025-02-06 ENCOUNTER — Ambulatory Visit: Admitting: Gastroenterology

## 2025-02-12 ENCOUNTER — Ambulatory Visit

## 2025-05-16 ENCOUNTER — Ambulatory Visit
